# Patient Record
Sex: Male | Born: 1970 | Race: Black or African American | Hispanic: No | Marital: Married | Smoking: Never smoker
Health system: Southern US, Community
[De-identification: ages and names within clinical notes are randomized; demographics above are authoritative.]

## PROBLEM LIST (undated history)

## (undated) DIAGNOSIS — Z9289 Personal history of other medical treatment: Secondary | ICD-10-CM

## (undated) DIAGNOSIS — L309 Dermatitis, unspecified: Secondary | ICD-10-CM

## (undated) DIAGNOSIS — R748 Abnormal levels of other serum enzymes: Secondary | ICD-10-CM

## (undated) DIAGNOSIS — E785 Hyperlipidemia, unspecified: Secondary | ICD-10-CM

## (undated) DIAGNOSIS — N189 Chronic kidney disease, unspecified: Secondary | ICD-10-CM

## (undated) DIAGNOSIS — K649 Unspecified hemorrhoids: Secondary | ICD-10-CM

## (undated) DIAGNOSIS — I10 Essential (primary) hypertension: Secondary | ICD-10-CM

## (undated) HISTORY — DX: Chronic kidney disease, unspecified: N18.9

## (undated) HISTORY — DX: Personal history of other medical treatment: Z92.89

## (undated) HISTORY — DX: Abnormal levels of other serum enzymes: R74.8

## (undated) HISTORY — DX: Hyperlipidemia, unspecified: E78.5

## (undated) HISTORY — DX: Dermatitis, unspecified: L30.9

## (undated) HISTORY — DX: Essential (primary) hypertension: I10

---

## 2006-03-14 ENCOUNTER — Emergency Department (HOSPITAL_COMMUNITY): Admission: EM | Admit: 2006-03-14 | Discharge: 2006-03-14 | Payer: Self-pay | Admitting: Emergency Medicine

## 2007-12-02 ENCOUNTER — Emergency Department (HOSPITAL_COMMUNITY): Admission: EM | Admit: 2007-12-02 | Discharge: 2007-12-02 | Payer: Self-pay | Admitting: *Deleted

## 2008-02-17 ENCOUNTER — Emergency Department (HOSPITAL_COMMUNITY): Admission: EM | Admit: 2008-02-17 | Discharge: 2008-02-17 | Payer: Self-pay | Admitting: Emergency Medicine

## 2008-09-13 ENCOUNTER — Emergency Department (HOSPITAL_COMMUNITY): Admission: EM | Admit: 2008-09-13 | Discharge: 2008-09-14 | Payer: Self-pay | Admitting: Emergency Medicine

## 2009-03-25 ENCOUNTER — Emergency Department (HOSPITAL_COMMUNITY): Admission: EM | Admit: 2009-03-25 | Discharge: 2009-03-25 | Payer: Self-pay | Admitting: Family Medicine

## 2009-03-28 ENCOUNTER — Observation Stay (HOSPITAL_COMMUNITY): Admission: EM | Admit: 2009-03-28 | Discharge: 2009-03-29 | Payer: Self-pay | Admitting: Emergency Medicine

## 2009-03-28 LAB — CONVERTED CEMR LAB
BUN: 16 mg/dL
CO2: 32 meq/L
Calcium: 9.3 mg/dL
Cholesterol: 232 mg/dL
Creatinine, Ser: 1.5 mg/dL
Glucose, Bld: 105 mg/dL
Hgb A1c MFr Bld: 6.2 %

## 2009-03-29 DIAGNOSIS — Z9289 Personal history of other medical treatment: Secondary | ICD-10-CM

## 2009-03-29 HISTORY — DX: Personal history of other medical treatment: Z92.89

## 2009-04-08 ENCOUNTER — Ambulatory Visit: Payer: Self-pay | Admitting: Nurse Practitioner

## 2009-04-08 DIAGNOSIS — E7800 Pure hypercholesterolemia, unspecified: Secondary | ICD-10-CM | POA: Insufficient documentation

## 2009-04-08 DIAGNOSIS — I10 Essential (primary) hypertension: Secondary | ICD-10-CM | POA: Insufficient documentation

## 2009-04-08 LAB — CONVERTED CEMR LAB
Bilirubin Urine: NEGATIVE
Cholesterol, target level: 200 mg/dL
Glucose, Urine, Semiquant: NEGATIVE
HDL goal, serum: 40 mg/dL
Urobilinogen, UA: 0.2

## 2009-06-27 ENCOUNTER — Ambulatory Visit: Payer: Self-pay | Admitting: Nurse Practitioner

## 2009-06-27 DIAGNOSIS — I491 Atrial premature depolarization: Secondary | ICD-10-CM | POA: Insufficient documentation

## 2009-06-28 ENCOUNTER — Ambulatory Visit: Payer: Self-pay | Admitting: Nurse Practitioner

## 2009-06-28 DIAGNOSIS — E876 Hypokalemia: Secondary | ICD-10-CM | POA: Insufficient documentation

## 2009-06-29 ENCOUNTER — Encounter (INDEPENDENT_AMBULATORY_CARE_PROVIDER_SITE_OTHER): Payer: Self-pay | Admitting: Nurse Practitioner

## 2009-06-29 LAB — CONVERTED CEMR LAB
Cholesterol: 149 mg/dL (ref 0–200)
Triglycerides: 67 mg/dL (ref <150)

## 2009-12-23 ENCOUNTER — Ambulatory Visit: Payer: Self-pay | Admitting: Nurse Practitioner

## 2010-03-05 LAB — CONVERTED CEMR LAB
ALT: 36 units/L (ref 0–53)
Alkaline Phosphatase: 94 units/L (ref 39–117)
Creatinine, Ser: 1.25 mg/dL (ref 0.40–1.50)
Eosinophils Relative: 1 % (ref 0–5)
Glucose, Bld: 77 mg/dL (ref 70–99)
HCT: 41.2 % (ref 39.0–52.0)
Hemoglobin: 13.6 g/dL (ref 13.0–17.0)
Lymphocytes Relative: 32 % (ref 12–46)
Lymphs Abs: 1.5 10*3/uL (ref 0.7–4.0)
MCHC: 33 g/dL (ref 30.0–36.0)
Monocytes Absolute: 0.4 10*3/uL (ref 0.1–1.0)
Monocytes Relative: 9 % (ref 3–12)
Neutro Abs: 2.7 10*3/uL (ref 1.7–7.7)
Neutrophils Relative %: 57 % (ref 43–77)
Rapid HIV Screen: NEGATIVE
Sodium: 141 meq/L (ref 135–145)
TSH: 2.077 microintl units/mL (ref 0.350–4.500)
Total Bilirubin: 0.4 mg/dL (ref 0.3–1.2)
Total Protein: 6.9 g/dL (ref 6.0–8.3)

## 2010-03-07 NOTE — Assessment & Plan Note (Signed)
Summary: NEW - Establish Care    Vital Signs:  Patient profile:   40 year old male Height:      64.50 inches Weight:      181.1 pounds BMI:     30.72 BSA:     1.89 Temp:     98.1 degrees F oral Pulse rate:   64 / minute Pulse rhythm:   regular Resp:     16 per minute BP sitting:   136 / 85  (left arm) Cuff size:   regular  Vitals Entered By: Levon Hedger (April 08, 2009 10:31 AM) CC: hospital follow-up [facility] for HTN, Hypertension Management, Lipid Management Is Patient Diabetic? No Pain Assessment Patient in pain? no       Does patient need assistance? Functional Status Self care Ambulation Normal   CC:  hospital follow-up [facility] for HTN, Hypertension Management, and Lipid Management.  History of Present Illness:  Pt into the office to establish care. No previous PCP No previous sugery  Pt was at home and reports that he checked his blood pressure at home and it was elevated. He was previously dx with htn and was started on fluid pills about 2 years ago but he did not maintain f/u so he did not get refills on the medications. This was when he was living in [place]. He has since moved to [place] BP was very elevated when he arrived at the hospital on 03/28/2009. (full d/c reviewed) Pt was admitted with chest pain, left side radiation.  BP elevated with systolic of 161.  BP at discharge was 127/92, with medications. pt has been taking meds as ordered as he filled  his supply of meds with the Rx written in hospital  Chest pain - ruled out for MI.   CXRAY normal. Stress myoview - normal  EF 60-65%     Hypertension History:      He denies headache, chest pain, and palpitations.  He notes no problems with any antihypertensive medication side effects.  Pt is taking medications as ordered.        Positive major cardiovascular risk factors include hyperlipidemia and hypertension.  Negative major cardiovascular risk factors include male age less than 19 years old, no history of  diabetes, and non-tobacco-user status.        Further assessment for target organ damage reveals no history of ASHD, cardiac end-organ damage (CHF/LVH), stroke/TIA, peripheral vascular disease, renal insufficiency, or hypertensive retinopathy.    Lipid Management History:      Positive NCEP/ATP III risk factors include hypertension.  Negative NCEP/ATP III risk factors include male age less than 43 years old, non-diabetic, non-tobacco-user status, no ASHD (atherosclerotic heart disease), no prior stroke/TIA, no peripheral vascular disease, and no history of aortic aneurysm.        The patient states that he does not know about the "Therapeutic Lifestyle Change" diet.  The patient does not know about adjunctive measures for cholesterol lowering.  He expresses no side effects from his lipid-lowering medication.  The patient denies any symptoms to suggest myopathy or liver disease.      Habits & Providers  Alcohol-Tobacco-Diet     Alcohol drinks/day: 0     Tobacco Status: never  Exercise-Depression-Behavior     Drug Use: never  Allergies (verified): No Known Drug Allergies  Family History: father (deceased at age 55 from MI) brother - htn  Social History: 2 children Denies any tobacco, ETOH or Drug useSmoking Status:  never Drug Use:  never  Review of Systems General:  Denies fever. CV:  Denies chest pain or discomfort. Resp:  Denies cough. GI:  Denies abdominal pain, nausea, and vomiting.  Physical Exam  General:  alert.   Head:  normocephalic.   Lungs:  normal breath sounds.   Heart:  normal rate and regular rhythm.   Abdomen:  normal bowel sounds.   Msk:  up to the exam table Neurologic:  alert & oriented X3.   Skin:  color normal.   Psych:  Oriented X3.     Impression & Recommendations:  Problem # 1:  HYPERTENSION, BENIGN ESSENTIAL (ICD-401.1) BP is stable today continue current meds DASH diet handout given His updated medication list for this problem  includes:    Amlodipine Besylate 10 Mg Tabs (Amlodipine besylate) ..... One tablet by mouth daily for blood pressure    Lisinopril 20 Mg Tabs (Lisinopril) .Marland Kitchen... 2 tablets by mouth daily for blood pressure  Orders: UA Dipstick w/o Micro (manual) ([address])  Problem # 2:  HYPERCHOLESTEROLEMIA (ICD-272.0) labs stable will recheck on next visit continue current meds His updated medication list for this problem includes:    Zocor 20 Mg Tabs (Simvastatin) ..... One tablet by mouth nightly for blood pressure  Problem # 3:  CORONARY ARTERY DISEASE, FAMILY HX (ICD-V17.3) reviewed with pt  Complete Medication List: 1)  Amlodipine Besylate 10 Mg Tabs (Amlodipine besylate) .... One tablet by mouth daily for blood pressure 2)  Lisinopril 20 Mg Tabs (Lisinopril) .... 2 tablets by mouth daily for blood pressure 3)  Zocor 20 Mg Tabs (Simvastatin) .... One tablet by mouth nightly for blood pressure 4)  Excedrin Migraine 250-250-65 Mg Tabs (Aspirin-acetaminophen-caffeine) .... Take 1-2 tabs by mouth every 8 hours as needed 5)  Fish Oil 1000 Mg Caps (Omega-3 fatty acids) .... Take one tablet by mouth daily 6)  Multivitamins Tabs (Multiple vitamin) .... Take one tablet by mouth daily  Other Orders: T-Urine Microalbumin w/creat. ratio [phone])  Hypertension Assessment/Plan:      The patient's hypertensive risk group is category B: At least one risk factor (excluding diabetes) with no target organ damage.  His calculated 10 year risk of coronary heart disease is 9 %.  Today's blood pressure is 136/85.  His blood pressure goal is < 140/90.  Lipid Assessment/Plan:      Based on NCEP/ATP III, the patient's risk factor category is "0-1 risk factors".  The patient's lipid goals are as follows: Total cholesterol goal is 200; LDL cholesterol goal is 160; HDL cholesterol goal is 40; Triglyceride goal is 150.    Patient Instructions: 1)  Follow up in 2 months for blood pressure 2)  You will need EKG. 3)   Fasting labs - do not eat after midnight before this visit. 4)  Take your medications as ordered 5)  You will need to get an orange card. Prescriptions: ZOCOR 20 MG TABS (SIMVASTATIN) One tablet by mouth nightly for blood pressure  #30 x 1   Entered and Authorized by:   Lehman Prom FNP   Signed by:   Lehman Prom FNP on 04/08/2009   Method used:   Print then Give to Patient   RxID:   [id number] LISINOPRIL 20 MG TABS (LISINOPRIL) 2 tablets by mouth daily for blood pressure  #60 x 1   Entered and Authorized by:   Lehman Prom FNP   Signed by:   Lehman Prom FNP on 04/08/2009   Method used:   Print then Give to Patient   RxID:  [id number] AMLODIPINE BESYLATE 10 MG TABS (AMLODIPINE BESYLATE) One tablet by mouth daily for blood pressure  #30 x 1   Entered and Authorized by:   Lehman Prom FNP   Signed by:   Lehman Prom FNP on 04/08/2009   Method used:   Print then Give to Patient   RxID:   [id number]   Laboratory Results   Urine Tests  Date/Time Received: April 08, 2009 10:49 AM  Date/Time Reported: April 08, 2009 10:49 AM   Routine Urinalysis   Color: lt. yellow Appearance: Clear Glucose: negative   (Normal Range: Negative) Bilirubin: negative   (Normal Range: Negative) Ketone: negative   (Normal Range: Negative) Spec. Gravity: 1.010   (Normal Range: 1.003-1.035) Blood: trace-intact   (Normal Range: Negative) pH: 7.5   (Normal Range: 5.0-8.0) Protein: trace   (Normal Range: Negative) Urobilinogen: 0.2   (Normal Range: 0-1) Nitrite: negative   (Normal Range: Negative) Leukocyte Esterace: negative   (Normal Range: Negative)         CXR  Procedure date:  03/28/2009  Findings:      Normal chest  Echocardiogram  Procedure date:  03/28/2009  Findings:      left ventricultar cavity size, moderate concentric hypertrophy. systolic function was normal.  estimated EF 60-65%, wall motionwas normal. Grade 1 diastolic  dysfunction   CXR  Procedure date:  03/28/2009  Findings:      Normal chest  Echocardiogram  Procedure date:  03/28/2009  Findings:      left ventricultar cavity size, moderate concentric hypertrophy. systolic function was normal.  estimated EF 60-65%, wall motionwas normal. Grade 1 diastolic dysfunction

## 2010-03-07 NOTE — Letter (Signed)
Summary: *HSN Results Follow up  [facility]  [address] [place], [place] [address]   Phone: [phone]  Fax: [phone]      06/28/2009   Joshua Hickman [address] [place], [place]  [address]   Dear  Mr. [name],                            ____S.Drinkard,FNP   ____D. Gore,FNP       ____B. McPherson,MD   ____V. Rankins,MD    ____E. Mulberry,MD    _X___N. Daphine Deutscher, FNP  ____D. Reche Dixon, MD    ____K. Philipp Deputy, MD    ____Other     This letter is to inform you that your recent test(s):  _______Pap Smear    ____X___Lab Test     _______X-ray   Comments:  Labs done during recent office visit ok except your potassium was a little low.  Try to eat potassium rich foods daily such as bananas, oranges, orange juice, sweet potatoes to keep levels normal.     _________________________________________________________ If you have any questions, please contact our office [phone].                    Sincerely,    Lehman Prom FNP [facility]

## 2010-03-07 NOTE — Assessment & Plan Note (Signed)
Summary: HTN/Hypercholesterolemia   Vital Signs:  Patient profile:   40 year old male Weight:      182.0 pounds BMI:     30.87 BSA:     1.89 Temp:     98.1 degrees F oral Pulse rate:   56 / minute Pulse rhythm:   regular Resp:     16 per minute BP sitting:   118 / 76  (left arm) Cuff size:   regular  Vitals Entered ByLevon Hedger (Jun 27, 2009 2:28 PM) CC: follow-up visit HTN, Hypertension Management, Lipid Management Is Patient Diabetic? No Pain Assessment Patient in pain? no       Does patient need assistance? Functional Status Self care Ambulation Normal   CC:  follow-up visit HTN, Hypertension Management, and Lipid Management.  History of Present Illness:  Pt into the office to f/u on htn Established care in this office 2 months ago.  No acute problems since last visit here Pt presents today with all his medications     Hypertension History:      He denies headache, chest pain, and palpitations.  He notes no problems with any antihypertensive medication side effects.        Positive major cardiovascular risk factors include hyperlipidemia and hypertension.  Negative major cardiovascular risk factors include male age less than 57 years old, no history of diabetes, and non-tobacco-user status.        Further assessment for target organ damage reveals no history of ASHD, cardiac end-organ damage (CHF/LVH), stroke/TIA, peripheral vascular disease, renal insufficiency, or hypertensive retinopathy.    Lipid Management History:      Positive NCEP/ATP III risk factors include hypertension.  Negative NCEP/ATP III risk factors include male age less than 62 years old, non-diabetic, non-tobacco-user status, no ASHD (atherosclerotic heart disease), no prior stroke/TIA, no peripheral vascular disease, and no history of aortic aneurysm.        The patient states that he does not know about the "Therapeutic Lifestyle Change" diet.  The patient does not know about adjunctive  measures for cholesterol lowering.  Adjunctive measures started by the patient include omega-3 supplements.  He expresses no side effects from his lipid-lowering medication.  The patient denies any symptoms to suggest myopathy or liver disease.     Habits & Providers  Alcohol-Tobacco-Diet     Alcohol drinks/day: 0     Tobacco Status: never  Exercise-Depression-Behavior     Have you felt down or hopeless? no     Have you felt little pleasure in things? no     Depression Counseling: not indicated; screening negative for depression     Drug Use: never  Medications Prior to Update: 1)  Amlodipine Besylate 10 Mg Tabs (Amlodipine Besylate) .... One Tablet By Mouth Daily For Blood Pressure 2)  Lisinopril 20 Mg Tabs (Lisinopril) .... 2 Tablets By Mouth Daily For Blood Pressure 3)  Zocor 20 Mg Tabs (Simvastatin) .... One Tablet By Mouth Nightly For Blood Pressure 4)  Excedrin Migraine 250-250-65 Mg Tabs (Aspirin-Acetaminophen-Caffeine) .... Take 1-2 Tabs By Mouth Every 8 Hours As Needed 5)  Fish Oil 1000 Mg Caps (Omega-3 Fatty Acids) .... Take One Tablet By Mouth Daily 6)  Multivitamins  Tabs (Multiple Vitamin) .... Take One Tablet By Mouth Daily  Allergies (verified): No Known Drug Allergies  Review of Systems CV:  Denies chest pain or discomfort. Resp:  Denies cough. GI:  Denies abdominal pain, nausea, and vomiting.  Physical Exam  General:  alert.  Head:  normocephalic.   Lungs:  normal breath sounds.   Heart:  normal rate and regular rhythm.   Abdomen:  normal bowel sounds.   Msk:  normal ROM.   Neurologic:  alert & oriented X3 and gait normal.     Impression & Recommendations:  Problem # 1:  HYPERTENSION, BENIGN ESSENTIAL (ICD-401.1) stable DASH  pt is taking meds as ordered His updated medication list for this problem includes:    Amlodipine Besylate 10 Mg Tabs (Amlodipine besylate) ..... One tablet by mouth daily for blood pressure    Lisinopril 20 Mg Tabs  (Lisinopril) .Marland Kitchen... 2 tablets by mouth daily for blood pressure  Orders: EKG w/ Interpretation ([address]) T-Comprehensive Metabolic Panel ([address]-47829) T-CBC w/Diff ([address]657) Rapid HIV  ([address]) T-TSH (295[address]-41324)  Problem # 2:  HYPERCHOLESTEROLEMIA (ICD-272.0) will check lipids on tomorrow as pt is not fasting today His updated medication list for this problem includes:    Zocor 20 Mg Tabs (Simvastatin) ..... One tablet by mouth nightly for blood pressure  Problem # 3:  ATRIAL PREMATURE BEATS (ICD-427.61) noted on EKG today reviewed with pt  Complete Medication List: 1)  Amlodipine Besylate 10 Mg Tabs (Amlodipine besylate) .... One tablet by mouth daily for blood pressure 2)  Lisinopril 20 Mg Tabs (Lisinopril) .... 2 tablets by mouth daily for blood pressure 3)  Zocor 20 Mg Tabs (Simvastatin) .... One tablet by mouth nightly for blood pressure 4)  Excedrin Migraine 250-250-65 Mg Tabs (Aspirin-acetaminophen-caffeine) .... Take 1-2 tabs by mouth every 8 hours as needed 5)  Fish Oil 1000 Mg Caps (Omega-3 fatty acids) .... Take one tablet by mouth daily 6)  Multivitamins Tabs (Multiple vitamin) .... Take one tablet by mouth daily  Hypertension Assessment/Plan:      The patient's hypertensive risk group is category B: At least one risk factor (excluding diabetes) with no target organ damage.  His calculated 10 year risk of coronary heart disease is 7 %.  Today's blood pressure is 118/76.  His blood pressure goal is < 140/90.  Lipid Assessment/Plan:      Based on NCEP/ATP III, the patient's risk factor category is "0-1 risk factors".  The patient's lipid goals are as follows: Total cholesterol goal is 200; LDL cholesterol goal is 160; HDL cholesterol goal is 40; Triglyceride goal is 150.     Patient Instructions: 1)  Labs done today - EXCEPT cholesterol 2)  You need to be fasting of everything except WATER when this is done. 3)  FRONT DESK - schedule an appointment in the early AM for  lipids (no charge - attach to today's visit) 4)  Follow up in 6 months - beginning of November for high blood pressure of sooner if necessary. Prescriptions: LISINOPRIL 20 MG TABS (LISINOPRIL) 2 tablets by mouth daily for blood pressure  #60 x 5   Entered and Authorized by:   Lehman Prom FNP   Signed by:   Lehman Prom FNP on 06/27/2009   Method used:   Print then Give to Patient   RxID:   [id number] AMLODIPINE BESYLATE 10 MG TABS (AMLODIPINE BESYLATE) One tablet by mouth daily for blood pressure  #30 x 5   Entered and Authorized by:   Lehman Prom FNP   Signed by:   Lehman Prom FNP on 06/27/2009   Method used:   Print then Give to Patient   RxID:   [id number]    EKG  Procedure date:  06/27/2009  Findings:      sinus rhythm  with premature atrial complexes  Laboratory Results  Date/Time Received: Jun 27, 2009   Other Tests  Rapid HIV: negative

## 2010-03-07 NOTE — Assessment & Plan Note (Signed)
Summary: HTN   Vital Signs:  Patient profile:   40 year old male Weight:      177.9 pounds BMI:     30.17 Pulse rate:   60 / minute Pulse rhythm:   regular Resp:     16 per minute BP sitting:   120 / 90  (left arm) Cuff size:   regular  Vitals Entered By: Levon Hedger (December 23, 2009 9:19 AM)  Nutrition Counseling: Patient's BMI is greater than 25 and therefore counseled on weight management options. CC: follow-up visit BP, Hypertension Management, Lipid Management Is Patient Diabetic? No Pain Assessment Patient in pain? no       Does patient need assistance? Functional Status Self care Ambulation Normal   CC:  follow-up visit BP, Hypertension Management, and Lipid Management.  History of Present Illness:  Pt into the office for f/u on htn. Pt is doing well.  No acute complaints. Pt is taking medications as ordered  Hypertension History:      He denies headache, chest pain, and palpitations.  He notes no problems with any antihypertensive medication side effects.        Positive major cardiovascular risk factors include hyperlipidemia and hypertension.  Negative major cardiovascular risk factors include male age less than 62 years old, no history of diabetes, and non-tobacco-user status.        Further assessment for target organ damage reveals no history of ASHD, cardiac end-organ damage (CHF/LVH), stroke/TIA, peripheral vascular disease, renal insufficiency, or hypertensive retinopathy.    Lipid Management History:      Positive NCEP/ATP III risk factors include hypertension.  Negative NCEP/ATP III risk factors include male age less than 19 years old, non-diabetic, non-tobacco-user status, no ASHD (atherosclerotic heart disease), no prior stroke/TIA, no peripheral vascular disease, and no history of aortic aneurysm.        The patient states that he does not know about the "Therapeutic Lifestyle Change" diet.  Adjunctive measures started by the patient include  weight reduction.  He expresses no side effects from his lipid-lowering medication.  Comments include: Pt is taking the medications as ordered.  The patient denies any symptoms to suggest myopathy or liver disease.      Allergies (verified): No Known Drug Allergies  Review of Systems General:  Denies fever. CV:  Denies chest pain or discomfort. Resp:  Denies cough. GI:  Denies abdominal pain, nausea, and vomiting.  Physical Exam  General:  alert.   Head:  normocephalic.   Lungs:  normal breath sounds.   Heart:  normal rate and regular rhythm.   Abdomen:  normal bowel sounds.   Msk:  normal ROM.   Neurologic:  alert & oriented X3.   Skin:  color normal.   Psych:  Oriented X3.     Impression & Recommendations:  Problem # 1:  HYPERTENSION, BENIGN ESSENTIAL (ICD-401.1) BP is doing well. Continue current medication   DASH diet reviewed declined flu vaccine today His updated medication list for this problem includes:    Amlodipine Besylate 10 Mg Tabs (Amlodipine besylate) ..... One tablet by mouth daily for blood pressure    Lisinopril 20 Mg Tabs (Lisinopril) .Marland Kitchen... 2 tablets by mouth daily for blood pressure  Problem # 2:  HYPERCHOLESTEROLEMIA (ICD-272.0) Will check on next visit continue current medications His updated medication list for this problem includes:    Zocor 20 Mg Tabs (Simvastatin) ..... One tablet by mouth nightly for cholesterol  Complete Medication List: 1)  Amlodipine Besylate 10 Mg  Tabs (Amlodipine besylate) .... One tablet by mouth daily for blood pressure 2)  Lisinopril 20 Mg Tabs (Lisinopril) .... 2 tablets by mouth daily for blood pressure 3)  Zocor 20 Mg Tabs (Simvastatin) .... One tablet by mouth nightly for cholesterol 4)  Excedrin Migraine 250-250-65 Mg Tabs (Aspirin-acetaminophen-caffeine) .... Take 1-2 tabs by mouth every 8 hours as needed 5)  Fish Oil 1000 Mg Caps (Omega-3 fatty acids) .... Take one tablet by mouth daily 6)  Multivitamins Tabs  (Multiple vitamin) .... Take one tablet by mouth daily  Hypertension Assessment/Plan:      The patient's hypertensive risk group is category B: At least one risk factor (excluding diabetes) with no target organ damage.  His calculated 10 year risk of coronary heart disease is 4 %.  Today's blood pressure is 120/90.  His blood pressure goal is < 140/90.  Lipid Assessment/Plan:      Based on NCEP/ATP III, the patient's risk factor category is "0-1 risk factors".  The patient's lipid goals are as follows: Total cholesterol goal is 200; LDL cholesterol goal is 160; HDL cholesterol goal is 40; Triglyceride goal is 150.    Patient Instructions: 1)  You have declined the flu vaccine.   2)  If you change you mind then call to schedule an appointment with the nurse 3)  Follow up in 6 months for blood pressure 4)  Take medications with water before this appointment but no food after midnight for labs.  Will need lipids, cbc, cmp, rapid hiv, tsh Prescriptions: ZOCOR 20 MG TABS (SIMVASTATIN) One tablet by mouth nightly for cholesterol  #30 x 6   Entered and Authorized by:   Lehman Prom FNP   Signed by:   Lehman Prom FNP on 12/23/2009   Method used:   Print then Give to Patient   RxID:   [id number] LISINOPRIL 20 MG TABS (LISINOPRIL) 2 tablets by mouth daily for blood pressure  #60 x 6   Entered and Authorized by:   Lehman Prom FNP   Signed by:   Lehman Prom FNP on 12/23/2009   Method used:   Print then Give to Patient   RxID:   [id number] AMLODIPINE BESYLATE 10 MG TABS (AMLODIPINE BESYLATE) One tablet by mouth daily for blood pressure  #30 x 6   Entered and Authorized by:   Lehman Prom FNP   Signed by:   Lehman Prom FNP on 12/23/2009   Method used:   Print then Give to Patient   RxID:   [id number]    Orders Added: 1)  Est. Patient Level III [56433]    Prevention & Chronic Care Immunizations   Influenza vaccine: declined  (12/23/2009)   Influenza  vaccine deferral: Refused  (12/23/2009)    Tetanus booster: Not documented    Pneumococcal vaccine: Not documented  Other Screening   Smoking status: never  (06/27/2009)  Lipids   Total Cholesterol: 149  (06/28/2009)   LDL: 88  (06/28/2009)   LDL Direct: Not documented   HDL: 48  (06/28/2009)   Triglycerides: 67  (06/28/2009)    SGOT (AST): 23  (06/27/2009)   SGPT (ALT): 36  (06/27/2009)   Alkaline phosphatase: 94  (06/27/2009)   Total bilirubin: 0.4  (06/27/2009)  Hypertension   Last Blood Pressure: 120 / 90  (12/23/2009)   Serum creatinine: 1.25  (06/27/2009)   Serum potassium 3.4  (06/27/2009)  Self-Management Support :    Hypertension self-management support: Not documented    Lipid self-management support: Not documented

## 2010-03-07 NOTE — Letter (Signed)
Summary: Lipid Letter  [facility]  [address] [place], [place] [address]   Phone: [phone]  Fax: [phone]    06/29/2009  Joshua Hickman [address] [place], [place]  [address]  Dear Joshua Hickman:  We have carefully reviewed your last lipid profile from 06/28/2009 and the results are noted below with a summary of recommendations for lipid management.    Cholesterol:       149     Goal: less than 200   HDL "good" Cholesterol:   48     Goal: greater than 40   LDL "bad" Cholesterol:   88     Goal: less than 100   Triglycerides:       67     Goal: less than 150    Your cholesterol labs are normal.     Current Medications: 1)    Amlodipine Besylate 10 Mg Tabs (Amlodipine besylate) .... One tablet by mouth daily for blood pressure 2)    Lisinopril 20 Mg Tabs (Lisinopril) .... 2 tablets by mouth daily for blood pressure 3)    Zocor 20 Mg Tabs (Simvastatin) .... One tablet by mouth nightly for blood pressure 4)    Excedrin Migraine 250-250-65 Mg Tabs (Aspirin-acetaminophen-caffeine) .... Take 1-2 tabs by mouth every 8 hours as needed 5)    Fish Oil 1000 Mg Caps (Omega-3 fatty acids) .... Take one tablet by mouth daily 6)    Multivitamins  Tabs (Multiple vitamin) .... Take one tablet by mouth daily  If you have any questions, please call. We appreciate being able to work with you.   Sincerely,    [facility] Lehman Prom FNP

## 2010-03-07 NOTE — Letter (Signed)
Summary: Handout Printed  Printed Handout:  - Diet - Low-Cholesterol Guidelines 

## 2010-04-26 LAB — POCT I-STAT, CHEM 8
BUN: 18 mg/dL (ref 6–23)
Calcium, Ion: 1.17 mmol/L (ref 1.12–1.32)
HCT: 50 % (ref 39.0–52.0)
Hemoglobin: 17 g/dL (ref 13.0–17.0)
TCO2: 31 mmol/L (ref 0–100)

## 2010-04-26 LAB — CBC
Hemoglobin: 15.7 g/dL (ref 13.0–17.0)
MCHC: 34.5 g/dL (ref 30.0–36.0)
RBC: 5.1 MIL/uL (ref 4.22–5.81)
RDW: 13 % (ref 11.5–15.5)
WBC: 5.4 10*3/uL (ref 4.0–10.5)

## 2010-04-26 LAB — CK TOTAL AND CKMB (NOT AT ARMC)
CK, MB: 1.9 ng/mL (ref 0.3–4.0)
Total CK: 529 U/L — ABNORMAL HIGH (ref 7–232)

## 2010-04-26 LAB — CARDIAC PANEL(CRET KIN+CKTOT+MB+TROPI)
CK, MB: 1.7 ng/mL (ref 0.3–4.0)
CK, MB: 1.8 ng/mL (ref 0.3–4.0)
Relative Index: 0.4 (ref 0.0–2.5)
Total CK: 473 U/L — ABNORMAL HIGH (ref 7–232)
Total CK: 509 U/L — ABNORMAL HIGH (ref 7–232)
Troponin I: 0.01 ng/mL (ref 0.00–0.06)

## 2010-04-26 LAB — BASIC METABOLIC PANEL
BUN: 15 mg/dL (ref 6–23)
BUN: 16 mg/dL (ref 6–23)
Calcium: 9.1 mg/dL (ref 8.4–10.5)
Chloride: 99 mEq/L (ref 96–112)
Creatinine, Ser: 1.5 mg/dL (ref 0.4–1.5)
GFR calc Af Amer: >60 mL/min (ref >60)
GFR calc non Af Amer: 52 mL/min — ABNORMAL LOW (ref >60)
Sodium: 138 mEq/L (ref 135–145)

## 2010-04-26 LAB — COMPREHENSIVE METABOLIC PANEL
ALT: 71 U/L — ABNORMAL HIGH (ref 0–53)
AST: 44 U/L — ABNORMAL HIGH (ref 0–37)
Alkaline Phosphatase: 92 U/L (ref 39–117)
GFR calc Af Amer: >60 mL/min (ref >60)
Potassium: 3.2 mEq/L — ABNORMAL LOW (ref 3.5–5.1)
Sodium: 140 mEq/L (ref 135–145)
Total Bilirubin: 0.8 mg/dL (ref 0.3–1.2)
Total Protein: 7.3 g/dL (ref 6.0–8.3)

## 2010-04-26 LAB — POCT CARDIAC MARKERS
CKMB, poc: <1 ng/mL — ABNORMAL LOW (ref 1.0–8.0)
Myoglobin, poc: 113 ng/mL (ref 12–200)
Troponin i, poc: <0.05 ng/mL (ref 0.00–0.09)

## 2010-04-26 LAB — MAGNESIUM: Magnesium: 2.4 mg/dL (ref 1.5–2.5)

## 2010-04-26 LAB — DIFFERENTIAL
Basophils Absolute: 0 10*3/uL (ref 0.0–0.1)
Eosinophils Absolute: 0.1 10*3/uL (ref 0.0–0.7)
Lymphs Abs: 2.1 10*3/uL (ref 0.7–4.0)
Monocytes Relative: 8 % (ref 3–12)

## 2010-04-26 LAB — LIPID PANEL
LDL Cholesterol: 177 mg/dL — ABNORMAL HIGH (ref 0–99)
Total CHOL/HDL Ratio: 5.4 RATIO
VLDL: 12 mg/dL (ref 0–40)

## 2010-04-26 LAB — HEMOGLOBIN A1C: Mean Plasma Glucose: 131 mg/dL

## 2010-05-14 ENCOUNTER — Emergency Department (HOSPITAL_COMMUNITY): Payer: Self-pay

## 2010-05-14 ENCOUNTER — Emergency Department (HOSPITAL_COMMUNITY)
Admission: EM | Admit: 2010-05-14 | Discharge: 2010-05-14 | Disposition: A | Discharge status: Home / Self Care | Payer: Self-pay | Attending: Emergency Medicine | Admitting: Emergency Medicine

## 2010-05-14 DIAGNOSIS — I10 Essential (primary) hypertension: Secondary | ICD-10-CM | POA: Insufficient documentation

## 2010-05-14 DIAGNOSIS — I498 Other specified cardiac arrhythmias: Secondary | ICD-10-CM | POA: Insufficient documentation

## 2010-05-14 DIAGNOSIS — R0789 Other chest pain: Secondary | ICD-10-CM | POA: Insufficient documentation

## 2010-05-14 LAB — GLUCOSE, CAPILLARY: Glucose-Capillary: 77 mg/dL (ref 70–99)

## 2010-05-14 LAB — POCT CARDIAC MARKERS
Troponin i, poc: <0.05 ng/mL (ref 0.00–0.09)
Troponin i, poc: <0.05 ng/mL (ref 0.00–0.09)

## 2010-05-14 LAB — BASIC METABOLIC PANEL
BUN: 13 mg/dL (ref 6–23)
CO2: 29 mEq/L (ref 19–32)
Chloride: 104 mEq/L (ref 96–112)
Creatinine, Ser: 1.39 mg/dL (ref 0.4–1.5)
Glucose, Bld: 102 mg/dL — ABNORMAL HIGH (ref 70–99)
Potassium: 3.4 mEq/L — ABNORMAL LOW (ref 3.5–5.1)

## 2010-05-14 LAB — CBC
MCH: 30.3 pg (ref 26.0–34.0)
MCV: 88 fL (ref 78.0–100.0)
Platelets: 223 10*3/uL (ref 150–400)
RBC: 4.91 MIL/uL (ref 4.22–5.81)
RDW: 12.9 % (ref 11.5–15.5)

## 2010-05-14 LAB — DIFFERENTIAL
Basophils Relative: 1 % (ref 0–1)
Eosinophils Absolute: 0.1 10*3/uL (ref 0.0–0.7)
Eosinophils Relative: 2 % (ref 0–5)
Lymphs Abs: 1.6 10*3/uL (ref 0.7–4.0)
Monocytes Relative: 9 % (ref 3–12)
Neutrophils Relative %: 51 % (ref 43–77)

## 2010-05-22 LAB — POCT CARDIAC MARKERS
CKMB, poc: <1 ng/mL — ABNORMAL LOW (ref 1.0–8.0)
Myoglobin, poc: 69.1 ng/mL (ref 12–200)
Troponin i, poc: <0.05 ng/mL (ref 0.00–0.09)

## 2010-11-07 LAB — DIFFERENTIAL
Basophils Absolute: 0
Eosinophils Relative: 2
Lymphocytes Relative: 36
Lymphs Abs: 1.7
Neutrophils Relative %: 54

## 2010-11-07 LAB — POCT I-STAT, CHEM 8
BUN: 14
Hemoglobin: 15.6
Potassium: 3.5
Sodium: 142
TCO2: 29

## 2010-11-07 LAB — POCT CARDIAC MARKERS
CKMB, poc: 1.3
CKMB, poc: 1.3
Myoglobin, poc: 98.3
Troponin i, poc: <0.05

## 2010-11-07 LAB — CBC
HCT: 43.3
Platelets: 301
RDW: 12.4
WBC: 4.7

## 2012-05-25 HISTORY — PX: TRANSTHORACIC ECHOCARDIOGRAM: SHX275

## 2012-05-28 ENCOUNTER — Other Ambulatory Visit (HOSPITAL_COMMUNITY): Payer: Self-pay | Admitting: Cardiovascular Disease

## 2012-05-28 DIAGNOSIS — R079 Chest pain, unspecified: Secondary | ICD-10-CM

## 2012-05-28 DIAGNOSIS — I10 Essential (primary) hypertension: Secondary | ICD-10-CM

## 2012-06-04 ENCOUNTER — Ambulatory Visit (HOSPITAL_COMMUNITY)
Admission: RE | Admit: 2012-06-04 | Discharge: 2012-06-04 | Disposition: A | Discharge status: Home / Self Care | Payer: BC Managed Care – PPO | Source: Ambulatory Visit | Attending: Cardiovascular Disease | Admitting: Cardiovascular Disease

## 2012-06-04 DIAGNOSIS — R079 Chest pain, unspecified: Secondary | ICD-10-CM | POA: Insufficient documentation

## 2012-06-04 DIAGNOSIS — I10 Essential (primary) hypertension: Secondary | ICD-10-CM | POA: Insufficient documentation

## 2012-06-04 NOTE — Progress Notes (Signed)
2D Echo Performed 06/04/2012    Dilan Novosad, RCS  

## 2012-06-23 ENCOUNTER — Other Ambulatory Visit: Payer: Self-pay | Admitting: Cardiovascular Disease

## 2012-06-23 LAB — LIPID PANEL
HDL: 36 mg/dL — ABNORMAL LOW (ref >39)
LDL Cholesterol: 70 mg/dL (ref 0–99)
Total CHOL/HDL Ratio: 3.4 Ratio

## 2012-08-11 ENCOUNTER — Telehealth: Payer: Self-pay | Admitting: Cardiovascular Disease

## 2012-08-11 NOTE — Telephone Encounter (Signed)
Leave message for patient to call office

## 2012-08-11 NOTE — Telephone Encounter (Signed)
Please check and see if you received a Rx refill for Mr. Joshua Hickman. He stated that [facility] has faxed it over several times since the 27th of June.

## 2012-08-13 ENCOUNTER — Other Ambulatory Visit: Payer: Self-pay

## 2012-08-13 MED ORDER — ATORVASTATIN CALCIUM 40 MG PO TABS: 40.0000 mg | ORAL_TABLET | Freq: Every day | ORAL | Status: DC

## 2012-08-13 NOTE — Telephone Encounter (Signed)
Rx was sent to pharmacy electronically. 

## 2012-12-06 ENCOUNTER — Encounter: Payer: Self-pay | Admitting: *Deleted

## 2012-12-08 ENCOUNTER — Encounter: Payer: Self-pay | Admitting: Internal Medicine

## 2012-12-09 ENCOUNTER — Ambulatory Visit: Payer: BC Managed Care – PPO | Admitting: Internal Medicine

## 2012-12-18 ENCOUNTER — Ambulatory Visit (INDEPENDENT_AMBULATORY_CARE_PROVIDER_SITE_OTHER): Payer: BC Managed Care – PPO | Admitting: Internal Medicine

## 2012-12-18 ENCOUNTER — Encounter: Payer: Self-pay | Admitting: Internal Medicine

## 2012-12-18 VITALS — BP 120/70 | HR 63 | Ht 65.5 in | Wt 185.5 lb

## 2012-12-18 DIAGNOSIS — E7800 Pure hypercholesterolemia, unspecified: Secondary | ICD-10-CM

## 2012-12-18 DIAGNOSIS — Z79899 Other long term (current) drug therapy: Secondary | ICD-10-CM

## 2012-12-18 DIAGNOSIS — E785 Hyperlipidemia, unspecified: Secondary | ICD-10-CM

## 2012-12-18 DIAGNOSIS — I10 Essential (primary) hypertension: Secondary | ICD-10-CM

## 2012-12-18 LAB — COMPREHENSIVE METABOLIC PANEL
AST: 26 U/L (ref 0–37)
Alkaline Phosphatase: 99 U/L (ref 39–117)
BUN: 14 mg/dL (ref 6–23)
Creat: 1.44 mg/dL — ABNORMAL HIGH (ref 0.50–1.35)
Total Bilirubin: 0.8 mg/dL (ref 0.3–1.2)

## 2012-12-18 MED ORDER — ESOMEPRAZOLE MAGNESIUM 40 MG PO CPDR: 40.0000 mg | DELAYED_RELEASE_CAPSULE | Freq: Every day | ORAL | Status: DC

## 2012-12-18 NOTE — Progress Notes (Signed)
OFFICE NOTE  Chief Complaint:  Establish new cardiology care  Primary Care Physician: No primary provider on file.  HPI:  Joshua Hickman is a pleasant 42 year old male who was previous he followed by Dr. Alanda Amass and this establishing care with me today.  His great-grandfather he is a patient of Dr. Alanda Amass as well. He recently had problems with indigestion symptoms and was placed on protonix, but feels this medicine actually is causing him some problems.  He is requesting a switch to a different medicine today. He has been taking atorvastatin and his total cholesterol came down nicely from 225-140, his LDL cholesterol is 70.  He is also on medication for hypertension and that is now much better controlled.  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  PMHx:  Past Medical History  Diagnosis Date  . Hypertension   . Hyperlipidemia   . History of nuclear stress test 03/29/2009    normal exam without evidence of exercise-induced sichemia    Past Surgical History  Procedure Laterality Date  . Transthoracic echocardiogram  05/25/2012    EF 55-60%, mild conc hypertrophy, normal systolic function; mild MR    FAMHx:  Family History  Problem Relation Age of Onset  . Heart attack Father   . Hypertension Mother   . Hypertension Brother     also abdominal cancer    SOCHx:   reports that he has never smoked. He has never used smokeless tobacco. He reports that he does not drink alcohol or use illicit drugs.  ALLERGIES:  No Known Allergies  ROS: A comprehensive review of systems was negative except for: Gastrointestinal: positive for reflux symptoms  HOME MEDS: Current Outpatient Prescriptions  Medication Sig Dispense Refill  . amLODipine (NORVASC) 10 MG tablet Take 10 mg by mouth daily.      Marland Kitchen aspirin 81 MG tablet Take 81 mg by mouth daily.      Marland Kitchen atorvastatin (LIPITOR) 40 MG tablet Take 1 tablet (40 mg total) by mouth  daily.  30 tablet  9  . lisinopril (PRINIVIL,ZESTRIL) 40 MG tablet Take 40 mg by mouth daily.      . pantoprazole (PROTONIX) 40 MG tablet Take 40 mg by mouth daily.       No current facility-administered medications for this visit.    LABS/IMAGING: No results found for this or any previous visit (from the past 48 hour(s)). No results found.  VITALS: BP 120/70  Pulse 63  Ht 5' 5.5" (1.664 m)  Wt 185 lb 8 oz (84.142 kg)  BMI 30.39 kg/m2  EXAM: General appearance: alert and no distress Neck: no carotid bruit and no JVD Lungs: clear to auscultation bilaterally Heart: regular rate and rhythm, S1, S2 normal, no murmur, click, rub or gallop Abdomen: soft, non-tender; bowel sounds normal; no masses,  no organomegaly Extremities: extremities normal, atraumatic, no cyanosis or edema Pulses: 2+ and symmetric Skin: Skin color, texture, turgor normal. No rashes or lesions Neurologic: Grossly normal Psych: Normal  EKG: Sinus rhythm with PACs at 63  ASSESSMENT: 1. Hypertension-controlled 2. Dyslipidemia on statin 3. PACs 4. GERD-intolerant to protonix  PLAN: 1.   Mr. Calixte is doing well and has had a marked improvement with medications. His blood pressures now well controlled on lisinopril and amlodipine. He is cholesterol is much improved on atorvastatin. His reflexes still an issue and he is intolerant to protonix since he says the medicine makes him "feel bad". He  is interested in trying a different medicine today and I recommended Nexium. I provide a prescription for that. He will have another cholesterol test today metabolic profile. Also given to primary care providers names as he does not have a primary and will need to establish one. Plan to see him back in 6 months.  Chrystie Nose, MD, [facility] Attending Cardiologist [facility]  Joshua Hickman,Joshua Hickman 12/18/2012, 9:12 AM

## 2012-12-18 NOTE — Patient Instructions (Signed)
Please have fasting blood work.  NMR with lipid, CMP  Your physician wants you to follow-up in: 6 months with Dr. Rennis Golden. You will receive a reminder letter in the mail two months in advance. If you don't receive a letter, please call our office to schedule the follow-up appointment.

## 2012-12-19 LAB — NMR LIPOPROFILE WITH LIPIDS
HDL Size: 8.5 nm — ABNORMAL LOW (ref >=9.2)
LDL (calc): 83 mg/dL (ref <100)
LDL Particle Number: 1385 nmol/L — ABNORMAL HIGH (ref <1000)
Large VLDL-P: 2.8 nmol/L — ABNORMAL HIGH (ref <=2.7)
Small LDL Particle Number: 963 nmol/L — ABNORMAL HIGH (ref <=527)
VLDL Size: 48.8 nm — ABNORMAL HIGH (ref <=46.6)

## 2012-12-29 ENCOUNTER — Encounter: Payer: Self-pay | Admitting: *Deleted

## 2015-01-18 ENCOUNTER — Emergency Department (HOSPITAL_COMMUNITY)
Admission: EM | Admit: 2015-01-18 | Discharge: 2015-01-18 | Disposition: A | Discharge status: Home / Self Care | Payer: Self-pay | Attending: Emergency Medicine | Admitting: Emergency Medicine

## 2015-01-18 ENCOUNTER — Encounter (HOSPITAL_COMMUNITY): Payer: Self-pay

## 2015-01-18 DIAGNOSIS — K5900 Constipation, unspecified: Secondary | ICD-10-CM | POA: Insufficient documentation

## 2015-01-18 DIAGNOSIS — E785 Hyperlipidemia, unspecified: Secondary | ICD-10-CM | POA: Insufficient documentation

## 2015-01-18 DIAGNOSIS — I10 Essential (primary) hypertension: Secondary | ICD-10-CM | POA: Insufficient documentation

## 2015-01-18 DIAGNOSIS — K645 Perianal venous thrombosis: Secondary | ICD-10-CM | POA: Insufficient documentation

## 2015-01-18 DIAGNOSIS — Z7982 Long term (current) use of aspirin: Secondary | ICD-10-CM | POA: Insufficient documentation

## 2015-01-18 DIAGNOSIS — Z79899 Other long term (current) drug therapy: Secondary | ICD-10-CM | POA: Insufficient documentation

## 2015-01-18 HISTORY — DX: Unspecified hemorrhoids: K64.9

## 2015-01-18 MED ORDER — BELLADONNA-OPIUM 16.2-30 MG RE SUPP: 30.0000 mg | Freq: Three times a day (TID) | RECTAL | Status: DC | PRN

## 2015-01-18 MED ORDER — HYDROCODONE-ACETAMINOPHEN 5-325 MG PO TABS: 1.0000 | ORAL_TABLET | ORAL | Status: DC | PRN

## 2015-01-18 MED ORDER — DOCUSATE SODIUM 50 MG PO CAPS: 50.0000 mg | ORAL_CAPSULE | Freq: Two times a day (BID) | ORAL | Status: DC

## 2015-01-18 NOTE — ED Provider Notes (Signed)
CSN: [id number]     Arrival date & time 01/18/15  1718 History   First MD Initiated Contact with Patient 01/18/15 2039     Chief Complaint  Patient presents with  . Hemorrhoids    Patient is a 44 y.o. male presenting with male genitourinary complaint. The history is provided by the patient.  Male GU Problem Presenting symptoms: no dysuria   Presenting symptoms comment:  Rectal pain Relieved by:  Nothing Exacerbated by: Sitting down, having a bowel movement. Associated symptoms: no abdominal pain, no diarrhea, no fever, no flank pain, no hematuria, no nausea, no urinary incontinence and no vomiting     Past Medical History  Diagnosis Date  . Hypertension   . Hyperlipidemia   . History of nuclear stress test 03/29/2009    normal exam without evidence of exercise-induced sichemia  . Hemorrhoid    Past Surgical History  Procedure Laterality Date  . Transthoracic echocardiogram  05/25/2012    EF 55-60%, mild conc hypertrophy, normal systolic function; mild MR   Family History  Problem Relation Age of Onset  . Heart attack Father   . Hypertension Mother   . Hypertension Brother     also abdominal cancer   Social History  Substance Use Topics  . Smoking status: Never Smoker   . Smokeless tobacco: Never Used  . Alcohol Use: No    Review of Systems  Constitutional: Negative for fever and chills.  HENT: Negative for rhinorrhea and sore throat.   Eyes: Negative for visual disturbance.  Respiratory: Negative for cough and shortness of breath.   Cardiovascular: Negative for chest pain.  Gastrointestinal: Positive for rectal pain. Negative for nausea, vomiting, abdominal pain, diarrhea, constipation, blood in stool and anal bleeding.       Dyschezia  Genitourinary: Negative for bladder incontinence, dysuria, hematuria and flank pain.  Musculoskeletal: Negative for back pain and neck pain.  Skin: Negative for rash.  Neurological: Negative for syncope and headaches.   Psychiatric/Behavioral: Negative for confusion.  All other systems reviewed and are negative.  Allergies  Review of patient's allergies indicates no known allergies.  Home Medications   Prior to Admission medications   Medication Sig Start Date End Date Taking? Authorizing Provider  aspirin 81 MG tablet Take 81 mg by mouth daily.   Yes Historical Provider, MD  atorvastatin (LIPITOR) 40 MG tablet Take 1 tablet (40 mg total) by mouth daily. 08/13/12  Yes Terance Ice, MD  lisinopril (PRINIVIL,ZESTRIL) 40 MG tablet Take 40 mg by mouth daily.   Yes Historical Provider, MD  belladonna-opium (B&O SUPPRETTES) 16.2-30 MG suppository Place 1 suppository rectally every 8 (eight) hours as needed for pain. 01/18/15   Gustavus Bryant, MD  docusate sodium (COLACE) 50 MG capsule Take 1 capsule (50 mg total) by mouth 2 (two) times daily. 01/18/15   Gustavus Bryant, MD  esomeprazole (NEXIUM) 40 MG capsule Take 1 capsule (40 mg total) by mouth daily at 12 noon. Patient not taking: Reported on 01/18/2015 12/18/12   Pixie Casino, MD  HYDROcodone-acetaminophen (NORCO/VICODIN) 5-325 MG tablet Take 1 tablet by mouth every 4 (four) hours as needed for severe pain. 01/18/15   Gustavus Bryant, MD   BP 124/93 mmHg  Pulse 49  Temp(Src) 98.3 F (36.8 C) (Oral)  Resp 18  SpO2 99% Physical Exam  Constitutional: He is oriented to person, place, and time. He appears well-developed and well-nourished. No distress.  HENT:  Head: Normocephalic and atraumatic.  Mouth/Throat: Oropharynx is clear and moist.  Eyes: EOM are normal.  Neck: Neck supple. No JVD present.  Cardiovascular: Normal rate, regular rhythm, normal heart sounds and intact distal pulses.   Pulmonary/Chest: Effort normal and breath sounds normal.  Abdominal: Soft. He exhibits no distension. There is no tenderness.  Genitourinary: Rectal exam shows external hemorrhoid.  Musculoskeletal: Normal range of motion. He exhibits no edema.  Neurological: He  is alert and oriented to person, place, and time. No cranial nerve deficit.  Skin: Skin is warm and dry.  Psychiatric: His behavior is normal.    ED Course  Procedures  None    MDM   Final diagnoses:  Thrombosed external hemorrhoid    Patient is a 44 y/O AAM male with history of hypertension, hyperlipidemia and hemorrhoids presents with rectal pain which he attributes to his hemorrhoids. On exam he does have a firm thrombosed external hemorrhoid. Patient has been using sitz baths and rectal creams which are not improving his symptoms. Patient denies bloody stools, nausea, vomiting, abdominal pain. Patient declined ED management of his hemorrhoid and opted for outpatient management. We'll prescribe Norco, B&O suppository, and stool softener. Patient given follow-up information for general surgery and instructed to follow-up in 1-2 weeks if symptoms are not improving. Patient voiced understanding and is agreeable with this plan.  Discussed with Dr. Vanita Panda.   Gustavus Bryant, MD 01/18/15 [id number]  Carmin Muskrat, MD 01/19/15 [phone]

## 2015-01-18 NOTE — Discharge Instructions (Signed)
Hemorrhoids  Hemorrhoids are puffy (swollen) veins around the rectum or anus. Hemorrhoids can cause pain, itching, bleeding, or irritation.  HOME CARE  · Eat foods with fiber, such as whole grains, beans, nuts, fruits, and vegetables. Ask your doctor about taking products with added fiber in them (fiber supplements).   · Drink enough fluid to keep your pee (urine) clear or pale yellow.  · Exercise often.  · Go to the bathroom when you have the urge to poop. Do not wait.  · Avoid straining to poop (bowel movement).  · Keep the butt area dry and clean. Use wet toilet paper or moist paper towels.  · Medicated creams and medicine inserted into the anus (anal suppository) may be used or applied as told.  · Only take medicine as told by your doctor.  · Take a warm water bath (sitz bath) for 15-20 minutes to ease pain. Do this 3-4 times a day.  · Place ice packs on the area if it is tender or puffy. Use the ice packs between the warm water baths.    Put ice in a plastic bag.    Place a towel between your skin and the bag.    Leave the ice on for 15-20 minutes, 03-04 times a day.  · Do not use a donut-shaped pillow or sit on the toilet for a long time.  GET HELP RIGHT AWAY IF:   · You have more pain that is not controlled by treatment or medicine.  · You have bleeding that will not stop.  · You have trouble or are unable to poop (bowel movement).  · You have pain or puffiness outside the area of the hemorrhoids.  MAKE SURE YOU:   · Understand these instructions.  · Will watch your condition.  · Will get help right away if you are not doing well or get worse.     This information is not intended to replace advice given to you by your health care provider. Make sure you discuss any questions you have with your health care provider.     Document Released: 11/01/2007 Document Revised: 01/09/2012 Document Reviewed: 12/04/2011  Elsevier Interactive Patient Education ©2016 Elsevier Inc.

## 2015-01-18 NOTE — ED Notes (Signed)
Pt here because he states he has external hemorrhoids. He states they are very painful and bleeding. Preparation H has not helped.

## 2015-01-18 NOTE — ED Notes (Signed)
Dr. Kathi Simpers at the bedside.

## 2015-01-18 NOTE — ED Notes (Signed)
Dr.Lockwood at bedside  

## 2016-05-08 ENCOUNTER — Ambulatory Visit (INDEPENDENT_AMBULATORY_CARE_PROVIDER_SITE_OTHER): Payer: BLUE CROSS/BLUE SHIELD | Admitting: Family Medicine

## 2016-05-08 ENCOUNTER — Encounter: Payer: Self-pay | Admitting: Family Medicine

## 2016-05-08 VITALS — BP 136/90 | HR 55 | Resp 16 | Ht 66.5 in | Wt 197.6 lb

## 2016-05-08 DIAGNOSIS — Z7689 Persons encountering health services in other specified circumstances: Secondary | ICD-10-CM | POA: Diagnosis not present

## 2016-05-08 DIAGNOSIS — I10 Essential (primary) hypertension: Secondary | ICD-10-CM | POA: Diagnosis not present

## 2016-05-08 LAB — BASIC METABOLIC PANEL
BUN: 14 mg/dL (ref 7–25)
CHLORIDE: 107 mmol/L (ref 98–110)
CO2: 28 mmol/L (ref 20–31)
Calcium: 9.2 mg/dL (ref 8.6–10.3)
Creat: 1.42 mg/dL — ABNORMAL HIGH (ref 0.60–1.35)
Glucose, Bld: 103 mg/dL — ABNORMAL HIGH (ref 65–99)
POTASSIUM: 3.9 mmol/L (ref 3.5–5.3)
Sodium: 144 mmol/L (ref 135–146)

## 2016-05-08 NOTE — Progress Notes (Signed)
   Subjective:    Patient ID: Joshua Hickman, male    DOB: [birth year], 46 y.o.   MRN: [id number]  HPI Chief Complaint  Patient presents with  . new pt    new pt bp issues for about 20 years. on meds now   He is new to the practice and here to establish care. No concerns or complaints today.   Previous medical care: urgent cares, no PCP in years.   Last CPE: 18 months ago.   Other providers: none   Past medical history: HTN - reports being diagnosed approximately 20 years ago. States it runs in his family also.  Check his BP at home occasionally but has not checked it in the past few weeks.   Hyperlipidemia and has been taking a statin for the past 4 years.   Social history: Lives with wife and 1 son (age 3), works as a Architectural technologist.  Denies smoking, drinking alcohol, drug use Diet: unhealthy per patient  Exercise: walks some days   Reviewed allergies, medications, past medical, surgical, family, and social history.   Review of Systems Pertinent positives and negatives in the history of present illness.     Objective:   Physical Exam BP 136/90   Pulse (!) 55   Resp 16   Ht 5' 6.5" (1.689 m)   Wt 197 lb 9.6 oz (89.6 kg)   SpO2 98%   BMI 31.42 kg/m   Alert and in no distress.  Pharyngeal area is normal. Neck is supple without adenopathy or thyromegaly. Cardiac exam shows a regular sinus rhythm without murmurs or gallops. Lungs are clear to auscultation. DTRs normal and symmetric. Extremities without edema, normal pulses.       Assessment & Plan:  HYPERTENSION, BENIGN ESSENTIAL - Plan: Basic metabolic panel  Encounter to establish care  Discussed that his BP is not within goal range. Recommend that he start checking his BP daily for the next 2 weeks and call me with his readings. Discussed healthy diet modifications and exercise. No changes to medications today but if his readings are not within goal in 2 weeks will consider making changes.  Return in 4  weeks to address HTN and to check fasting lipids. He may also want to do a CPE on that date and that will be fine as well.

## 2016-05-08 NOTE — Patient Instructions (Signed)
Start checking your BP daily for the next 2 weeks.  Goal BP range is <130/80 If you are seeing readings higher than this on a regular basis then your BP is not well controlled and you will need to come in and talk about possibly adjusting your medication.  Call with your readings in 2 weeks and return in 4 weeks for a follow up appointment.  We could also do a physical exam on the same day and check your cholesterol if you come in fasting (nothing to eat or drink for 6 hours)   DASH Eating Plan DASH stands for "Dietary Approaches to Stop Hypertension." The DASH eating plan is a healthy eating plan that has been shown to reduce high blood pressure (hypertension). It may also reduce your risk for type 2 diabetes, heart disease, and stroke. The DASH eating plan may also help with weight loss. What are tips for following this plan? General guidelines   Avoid eating more than 2,300 mg (milligrams) of salt (sodium) a day. If you have hypertension, you may need to reduce your sodium intake to 1,500 mg a day.  Limit alcohol intake to no more than 1 drink a day for nonpregnant women and 2 drinks a day for men. One drink equals 12 oz of beer, 5 oz of wine, or 1 oz of hard liquor.  Work with your health care provider to maintain a healthy body weight or to lose weight. Ask what an ideal weight is for you.  Get at least 30 minutes of exercise that causes your heart to beat faster (aerobic exercise) most days of the week. Activities may include walking, swimming, or biking.  Work with your health care provider or diet and nutrition specialist (dietitian) to adjust your eating plan to your individual calorie needs. Reading food labels   Check food labels for the amount of sodium per serving. Choose foods with less than 5 percent of the Daily Value of sodium. Generally, foods with less than 300 mg of sodium per serving fit into this eating plan.  To find whole grains, look for the word "whole" as the  first word in the ingredient list. Shopping   Buy products labeled as "low-sodium" or "no salt added."  Buy fresh foods. Avoid canned foods and premade or frozen meals. Cooking   Avoid adding salt when cooking. Use salt-free seasonings or herbs instead of table salt or sea salt. Check with your health care provider or pharmacist before using salt substitutes.  Do not fry foods. Cook foods using healthy methods such as baking, boiling, grilling, and broiling instead.  Cook with heart-healthy oils, such as olive, canola, soybean, or sunflower oil. Meal planning    Eat a balanced diet that includes:  5 or more servings of fruits and vegetables each day. At each meal, try to fill half of your plate with fruits and vegetables.  Up to 6-8 servings of whole grains each day.  Less than 6 oz of lean meat, poultry, or fish each day. A 3-oz serving of meat is about the same size as a deck of cards. One egg equals 1 oz.  2 servings of low-fat dairy each day.  A serving of nuts, seeds, or beans 5 times each week.  Heart-healthy fats. Healthy fats called Omega-3 fatty acids are found in foods such as flaxseeds and coldwater fish, like sardines, salmon, and mackerel.  Limit how much you eat of the following:  Canned or prepackaged foods.  Food that is high  in trans fat, such as fried foods.  Food that is high in saturated fat, such as fatty meat.  Sweets, desserts, sugary drinks, and other foods with added sugar.  Full-fat dairy products.  Do not salt foods before eating.  Try to eat at least 2 vegetarian meals each week.  Eat more home-cooked food and less restaurant, buffet, and fast food.  When eating at a restaurant, ask that your food be prepared with less salt or no salt, if possible. What foods are recommended? The items listed may not be a complete list. Talk with your dietitian about what dietary choices are best for you. Grains  Whole-grain or whole-wheat bread.  Whole-grain or whole-wheat pasta. Brown rice. Modena Morrow. Bulgur. Whole-grain and low-sodium cereals. Pita bread. Low-fat, low-sodium crackers. Whole-wheat flour tortillas. Vegetables  Fresh or frozen vegetables (raw, steamed, roasted, or grilled). Low-sodium or reduced-sodium tomato and vegetable juice. Low-sodium or reduced-sodium tomato sauce and tomato paste. Low-sodium or reduced-sodium canned vegetables. Fruits  All fresh, dried, or frozen fruit. Canned fruit in natural juice (without added sugar). Meat and other protein foods  Skinless chicken or [place]. Ground chicken or [place]. Pork with fat trimmed off. Fish and seafood. Egg whites. Dried beans, peas, or lentils. Unsalted nuts, nut butters, and seeds. Unsalted canned beans. Lean cuts of beef with fat trimmed off. Low-sodium, lean deli meat. Dairy  Low-fat (1%) or fat-free (skim) milk. Fat-free, low-fat, or reduced-fat cheeses. Nonfat, low-sodium ricotta or cottage cheese. Low-fat or nonfat yogurt. Low-fat, low-sodium cheese. Fats and oils  Soft margarine without trans fats. Vegetable oil. Low-fat, reduced-fat, or light mayonnaise and salad dressings (reduced-sodium). Canola, safflower, olive, soybean, and sunflower oils. Avocado. Seasoning and other foods  Herbs. Spices. Seasoning mixes without salt. Unsalted popcorn and pretzels. Fat-free sweets. What foods are not recommended? The items listed may not be a complete list. Talk with your dietitian about what dietary choices are best for you. Grains  Baked goods made with fat, such as croissants, muffins, or some breads. Dry pasta or rice meal packs. Vegetables  Creamed or fried vegetables. Vegetables in a cheese sauce. Regular canned vegetables (not low-sodium or reduced-sodium). Regular canned tomato sauce and paste (not low-sodium or reduced-sodium). Regular tomato and vegetable juice (not low-sodium or reduced-sodium). Angie Fava. Olives. Fruits  Canned fruit in a light or heavy  syrup. Fried fruit. Fruit in cream or butter sauce. Meat and other protein foods  Fatty cuts of meat. Ribs. Fried meat. Berniece Salines. Sausage. Bologna and other processed lunch meats. Salami. Fatback. Hotdogs. Bratwurst. Salted nuts and seeds. Canned beans with added salt. Canned or smoked fish. Whole eggs or egg yolks. Chicken or [place] with skin. Dairy  Whole or 2% milk, cream, and half-and-half. Whole or full-fat cream cheese. Whole-fat or sweetened yogurt. Full-fat cheese. Nondairy creamers. Whipped toppings. Processed cheese and cheese spreads. Fats and oils  Butter. Stick margarine. Lard. Shortening. Ghee. Bacon fat. Tropical oils, such as coconut, palm kernel, or palm oil. Seasoning and other foods  Salted popcorn and pretzels. Onion salt, garlic salt, seasoned salt, table salt, and sea salt. Worcestershire sauce. Tartar sauce. Barbecue sauce. Teriyaki sauce. Soy sauce, including reduced-sodium. Steak sauce. Canned and packaged gravies. Fish sauce. Oyster sauce. Cocktail sauce. Horseradish that you find on the shelf. Ketchup. Mustard. Meat flavorings and tenderizers. Bouillon cubes. Hot sauce and Tabasco sauce. Premade or packaged marinades. Premade or packaged taco seasonings. Relishes. Regular salad dressings. Where to find more information:  National Heart, Lung, and Blood Institute: https://wilson-eaton.com/  American  Heart Association: www.heart.org Summary  The DASH eating plan is a healthy eating plan that has been shown to reduce high blood pressure (hypertension). It may also reduce your risk for type 2 diabetes, heart disease, and stroke.  With the DASH eating plan, you should limit salt (sodium) intake to 2,300 mg a day. If you have hypertension, you may need to reduce your sodium intake to 1,500 mg a day.  When on the DASH eating plan, aim to eat more fresh fruits and vegetables, whole grains, lean proteins, low-fat dairy, and heart-healthy fats.  Work with your health care provider or diet  and nutrition specialist (dietitian) to adjust your eating plan to your individual calorie needs. This information is not intended to replace advice given to you by your health care provider. Make sure you discuss any questions you have with your health care provider. Document Released: 01/11/2011 Document Revised: 01/16/2016 Document Reviewed: 01/16/2016 Elsevier Interactive Patient Education  2017 [employer].

## 2016-06-11 ENCOUNTER — Encounter: Payer: Self-pay | Admitting: Family Medicine

## 2016-06-11 ENCOUNTER — Ambulatory Visit (INDEPENDENT_AMBULATORY_CARE_PROVIDER_SITE_OTHER): Payer: BLUE CROSS/BLUE SHIELD | Admitting: Family Medicine

## 2016-06-11 VITALS — BP 120/82 | HR 56 | Ht 66.75 in | Wt 195.6 lb

## 2016-06-11 DIAGNOSIS — Z0000 Encounter for general adult medical examination without abnormal findings: Secondary | ICD-10-CM

## 2016-06-11 DIAGNOSIS — Z113 Encounter for screening for infections with a predominantly sexual mode of transmission: Secondary | ICD-10-CM

## 2016-06-11 DIAGNOSIS — Z79899 Other long term (current) drug therapy: Secondary | ICD-10-CM

## 2016-06-11 DIAGNOSIS — R319 Hematuria, unspecified: Secondary | ICD-10-CM

## 2016-06-11 DIAGNOSIS — E785 Hyperlipidemia, unspecified: Secondary | ICD-10-CM

## 2016-06-11 DIAGNOSIS — I10 Essential (primary) hypertension: Secondary | ICD-10-CM | POA: Diagnosis not present

## 2016-06-11 DIAGNOSIS — Z23 Encounter for immunization: Secondary | ICD-10-CM | POA: Diagnosis not present

## 2016-06-11 LAB — POCT URINALYSIS DIPSTICK
BILIRUBIN UA: NEGATIVE
Glucose, UA: NEGATIVE
KETONES UA: NEGATIVE
LEUKOCYTES UA: NEGATIVE
Nitrite, UA: NEGATIVE
PROTEIN UA: NEGATIVE
Spec Grav, UA: >=1.03 — AB (ref 1.010–1.025)
Urobilinogen, UA: 0.2 E.U./dL
pH, UA: 6 (ref 5.0–8.0)

## 2016-06-11 LAB — LIPID PANEL
CHOL/HDL RATIO: 3.6 ratio (ref <5.0)
CHOLESTEROL: 153 mg/dL (ref <200)
HDL: 42 mg/dL (ref >40)
LDL Cholesterol: 98 mg/dL (ref <100)
Triglycerides: 66 mg/dL (ref <150)
VLDL: 13 mg/dL (ref <30)

## 2016-06-11 LAB — BASIC METABOLIC PANEL
BUN: 16 mg/dL (ref 7–25)
CO2: 27 mmol/L (ref 20–31)
Calcium: 9.3 mg/dL (ref 8.6–10.3)
Chloride: 107 mmol/L (ref 98–110)
Creat: 1.55 mg/dL — ABNORMAL HIGH (ref 0.60–1.35)
GLUCOSE: 95 mg/dL (ref 65–99)
POTASSIUM: 4.3 mmol/L (ref 3.5–5.3)
Sodium: 145 mmol/L (ref 135–146)

## 2016-06-11 LAB — CBC WITH DIFFERENTIAL/PLATELET
BASOS ABS: 41 {cells}/uL (ref 0–200)
Basophils Relative: 1 %
EOS ABS: 82 {cells}/uL (ref 15–500)
Eosinophils Relative: 2 %
HCT: 46.3 % (ref 38.5–50.0)
HEMOGLOBIN: 15.5 g/dL (ref 13.2–17.1)
LYMPHS ABS: 1599 {cells}/uL (ref 850–3900)
Lymphocytes Relative: 39 %
MCH: 29.8 pg (ref 27.0–33.0)
MCHC: 33.5 g/dL (ref 32.0–36.0)
MCV: 89 fL (ref 80.0–100.0)
MPV: 10.3 fL (ref 7.5–12.5)
Monocytes Absolute: 328 cells/uL (ref 200–950)
Monocytes Relative: 8 %
NEUTROS ABS: 2050 {cells}/uL (ref 1500–7800)
Neutrophils Relative %: 50 %
Platelets: 263 10*3/uL (ref 140–400)
RBC: 5.2 MIL/uL (ref 4.20–5.80)
RDW: 13.8 % (ref 11.0–15.0)
WBC: 4.1 10*3/uL (ref 4.0–10.5)

## 2016-06-11 LAB — TSH: TSH: 1.64 mIU/L (ref 0.40–4.50)

## 2016-06-11 NOTE — Patient Instructions (Signed)
You received a Tdap (Tetanus shot) today.  Call and schedule a dental cleaning at your convenience.  We will call you with lab results.   Preventative Care for Adults, Male       REGULAR HEALTH EXAMS:  A routine yearly physical is a good way to check in with your primary care provider about your health and preventive screening. It is also an opportunity to share updates about your health and any concerns you have, and receive a thorough all-over exam.   Most health insurance companies pay for at least some preventative services.  Check with your health plan for specific coverages.  WHAT PREVENTATIVE SERVICES DO MEN NEED?  Adult men should have their weight and blood pressure checked regularly.   Men age 60 and older should have their cholesterol levels checked regularly.  Beginning at age 28 and continuing to age 70, men should be screened for colorectal cancer.  Certain people should may need continued testing until age 17.  Other cancer screening may include exams for testicular and prostate cancer.  Updating vaccinations is part of preventative care.  Vaccinations help protect against diseases such as the flu.  Lab tests are generally done as part of preventative care to screen for anemia and blood disorders, to screen for problems with the kidneys and liver, to screen for bladder problems, to check blood sugar, and to check your cholesterol level.  Preventative services generally include counseling about diet, exercise, avoiding tobacco, drugs, excessive alcohol consumption, and sexually transmitted infections.    GENERAL RECOMMENDATIONS FOR GOOD HEALTH:  Healthy diet:  Eat a variety of foods, including fruit, vegetables, animal or vegetable protein, such as meat, fish, chicken, and eggs, or beans, lentils, tofu, and grains, such as rice.  Drink plenty of water daily.  Decrease saturated fat in the diet, avoid lots of red meat, processed foods, sweets, fast foods, and fried  foods.  Exercise:  Aerobic exercise helps maintain good heart health. At least 30-40 minutes of moderate-intensity exercise is recommended. For example, a brisk walk that increases your heart rate and breathing. This should be done on most days of the week.   Find a type of exercise or a variety of exercises that you enjoy so that it becomes a part of your daily life.  Examples are running, walking, swimming, water aerobics, and biking.  For motivation and support, explore group exercise such as aerobic class, spin class, Zumba, Yoga,or  martial arts, etc.    Set exercise goals for yourself, such as a certain weight goal, walk or run in a race such as a 5k walk/run.  Speak to your primary care provider about exercise goals.  Disease prevention:  If you smoke or chew tobacco, find out from your caregiver how to quit. It can literally save your life, no matter how long you have been a tobacco user. If you do not use tobacco, never begin.   Maintain a healthy diet and normal weight. Increased weight leads to problems with blood pressure and diabetes.   The Body Mass Index or BMI is a way of measuring how much of your body is fat. Having a BMI above 27 increases the risk of heart disease, diabetes, hypertension, stroke and other problems related to obesity. Your caregiver can help determine your BMI and based on it develop an exercise and dietary program to help you achieve or maintain this important measurement at a healthful level.  High blood pressure causes heart and blood vessel problems.  Persistent high blood pressure should be treated with medicine if weight loss and exercise do not work.   Fat and cholesterol leaves deposits in your arteries that can block them. This causes heart disease and vessel disease elsewhere in your body.  If your cholesterol is found to be high, or if you have heart disease or certain other medical conditions, then you may need to have your cholesterol monitored  frequently and be treated with medication.   Ask if you should have a stress test if your history suggests this. A stress test is a test done on a treadmill that looks for heart disease. This test can find disease prior to there being a problem.  Avoid drinking alcohol in excess (more than two drinks per day).  Avoid use of street drugs. Do not share needles with anyone. Ask for professional help if you need assistance or instructions on stopping the use of alcohol, cigarettes, and/or drugs.  Brush your teeth twice a day with fluoride toothpaste, and floss once a day. Good oral hygiene prevents tooth decay and gum disease. The problems can be painful, unattractive, and can cause other health problems. Visit your dentist for a routine oral and dental check up and preventive care every 6-12 months.   Look at your skin regularly.  Use a mirror to look at your back. Notify your caregivers of changes in moles, especially if there are changes in shapes, colors, a size larger than a pencil eraser, an irregular border, or development of new moles.  Safety:  Use seatbelts 100% of the time, whether driving or as a passenger.  Use safety devices such as hearing protection if you work in environments with loud noise or significant background noise.  Use safety glasses when doing any work that could send debris in to the eyes.  Use a helmet if you ride a bike or motorcycle.  Use appropriate safety gear for contact sports.  Talk to your caregiver about gun safety.  Use sunscreen with a SPF (or skin protection factor) of 15 or greater.  Lighter skinned people are at a greater risk of skin cancer. Don't forget to also wear sunglasses in order to protect your eyes from too much damaging sunlight. Damaging sunlight can accelerate cataract formation.   Practice safe sex. Use condoms. Condoms are used for birth control and to help reduce the spread of sexually transmitted infections (or STIs).  Some of the STIs are  gonorrhea (the clap), chlamydia, syphilis, trichomonas, herpes, HPV (human papilloma virus) and HIV (human immunodeficiency virus) which causes AIDS. The herpes, HIV and HPV are viral illnesses that have no cure. These can result in disability, cancer and death.   Keep carbon monoxide and smoke detectors in your home functioning at all times. Change the batteries every 6 months or use a model that plugs into the wall.   Vaccinations:  Stay up to date with your tetanus shots and other required immunizations. You should have a booster for tetanus every 10 years. Be sure to get your flu shot every year, since 5%-20% of the [place] population comes down with the flu. The flu vaccine changes each year, so being vaccinated once is not enough. Get your shot in the fall, before the flu season peaks.   Other vaccines to consider:  Pneumococcal vaccine to protect against certain types of pneumonia.  This is normally recommended for adults age 90 or older.  However, adults younger than 48 years old with certain underlying conditions such as  diabetes, heart or lung disease should also receive the vaccine.  Shingles vaccine to protect against Varicella Zoster if you are older than age 7, or younger than 48 years old with certain underlying illness.  Hepatitis A vaccine to protect against a form of infection of the liver by a virus acquired from food.  Hepatitis B vaccine to protect against a form of infection of the liver by a virus acquired from blood or body fluids, particularly if you work in health care.  If you plan to travel internationally, check with your local health department for specific vaccination recommendations.  Cancer Screening:  Most routine colon cancer screening begins at the age of 3. On a yearly basis, doctors may provide special easy to use take-home tests to check for hidden blood in the stool. Sigmoidoscopy or colonoscopy can detect the earliest forms of colon cancer and is life  saving. These tests use a small camera at the end of a tube to directly examine the colon. Speak to your caregiver about this at age 80, when routine screening begins (and is repeated every 5 years unless early forms of pre-cancerous polyps or small growths are found).   At the age of 76 men usually start screening for prostate cancer every year. Screening may begin at a younger age for those with higher risk. Those at higher risk include African-Americans or having a family history of prostate cancer. There are two types of tests for prostate cancer:   Prostate-specific antigen (PSA) testing. Recent studies raise questions about prostate cancer using PSA and you should discuss this with your caregiver.   Digital rectal exam (in which your doctor's lubricated and gloved finger feels for enlargement of the prostate through the anus).   Screening for testicular cancer.  Do a monthly exam of your testicles. Gently roll each testicle between your thumb and fingers, feeling for any abnormal lumps. The best time to do this is after a hot shower or bath when the tissues are looser. Notify your caregivers of any lumps, tenderness or changes in size or shape immediately.

## 2016-06-11 NOTE — Progress Notes (Signed)
Subjective:    Patient ID: Joshua Hickman, male    DOB: September 04, 1970, 46 y.o.   MRN: [id number]  HPI Chief Complaint  Patient presents with  . cpe    fasting cpe. bo running high   He is here for a complete physical exam. No concerns or complaints today.   Last CPE: 19 months ago.   Other providers: none  Past medical history: HTN- has not been checking his BP at home. Reports good medication compliance.  States he was diagnosed with hyperlipidemia approximately 4 years ago and has been taking a statin.   Denies smoking, drinking alcohol, drug use Diet: fairly unhealthy, states he cut back on soda.  Exercise: walks at work but not otherwise.   Immunizations: unknown last Tdap  Health maintenance:  Colonoscopy: N/A  Last PSA: N/A Last Dental Exam: 4 years ago.  Last Eye Exam: has reading glasses. 4 years ago.   Wears seatbelt always, smoke detectors in home and functioning, does not text while driving, feels safe in home environment.  Reviewed allergies, medications, past medical, surgical, family, and social history.   Review of Systems Review of Systems Constitutional: -fever, -chills, -sweats, -unexpected weight change,-fatigue ENT: -runny nose, -ear pain, -sore throat Cardiology:  -chest pain, -palpitations, -edema Respiratory: -cough, -shortness of breath, -wheezing Gastroenterology: -abdominal pain, -nausea, -vomiting, -diarrhea, -constipation  Hematology: -bleeding or bruising problems Musculoskeletal: -arthralgias, -myalgias, -joint swelling, -back pain Ophthalmology: -vision changes Urology: -dysuria, -difficulty urinating, -hematuria, -urinary frequency, -urgency Neurology: -headache, -weakness, -tingling, -numbness       Objective:   Physical Exam BP 120/82   Pulse (!) 56   Ht 5' 6.75" (1.695 m)   Wt 195 lb 9.6 oz (88.7 kg)   BMI 30.87 kg/m   General Appearance:    Alert, cooperative, no distress, appears stated age  Head:    Normocephalic,  without obvious abnormality, atraumatic  Eyes:    PERRL, conjunctiva/corneas clear, EOM's intact, fundi    benign  Ears:    Normal TM's and external ear canals  Nose:   Nares normal, mucosa normal, no drainage or sinus   tenderness  Throat:   Lips, mucosa, and tongue normal; teeth and gums normal  Neck:   Supple, no lymphadenopathy;  thyroid:  no   enlargement/tenderness/nodules; no carotid   bruit or JVD  Back:    Spine nontender, no curvature, ROM normal, no CVA     tenderness  Lungs:     Clear to auscultation bilaterally without wheezes, rales or     ronchi; respirations unlabored  Chest Wall:    No tenderness or deformity   Heart:    Regular rate and rhythm, S1 and S2 normal, no murmur, rub   or gallop  Breast Exam:    No chest wall tenderness, masses or gynecomastia  Abdomen:     Soft, non-tender, nondistended, normoactive bowel sounds,    no masses, no hepatosplenomegaly  Genitalia:    Refused.   Rectal:    Refused.   Extremities:   No clubbing, cyanosis or edema  Pulses:   2+ and symmetric all extremities  Skin:   Skin color, texture, turgor normal, no rashes or lesions  Lymph nodes:   Cervical, supraclavicular, and axillary nodes normal  Neurologic:   CNII-XII intact, normal strength, sensation and gait; reflexes 2+ and symmetric throughout          Psych:   Normal mood, affect, hygiene and grooming.    Urinalysis dipstick: spec grav 1.030,  trace blood       Assessment & Plan:  Routine general medical examination at a health care facility - Plan: CBC with Differential/Platelet, POCT urinalysis dipstick, TSH, Basic metabolic panel  HYPERTENSION, BENIGN ESSENTIAL - Plan: Basic metabolic panel  Medication management - Plan: Lipid panel  Hyperlipidemia, unspecified hyperlipidemia type - Plan: Lipid panel  Need for Tdap vaccination - Plan: Tdap vaccine greater than or equal to 7yo IM  Screen for STD (sexually transmitted disease) - Plan: RPR, GC/Chlamydia Probe Amp, HIV  antibody  Hematuria, unspecified type - Plan: Urine Microscopic  Discussed that he appears to be doing well overall.  Blood pressure is within goal today. Continue on current medication. Counseled on low sodium diet and exercise.  Hyperlipidemia- will check fasting lipids. He will continue taking statin. Lifestyle modifications discussed.  Tdap given. STD testing done per patient request.  Will send urine for microscopic to rule out hematuria.  Refused genital exam, advised self testicular exams.  Follow up pending labs.

## 2016-06-12 ENCOUNTER — Encounter: Payer: Self-pay | Admitting: Family Medicine

## 2016-06-12 DIAGNOSIS — N189 Chronic kidney disease, unspecified: Secondary | ICD-10-CM | POA: Insufficient documentation

## 2016-06-12 LAB — GC/CHLAMYDIA PROBE AMP
CT Probe RNA: NOT DETECTED
GC Probe RNA: NOT DETECTED

## 2016-06-12 LAB — RPR

## 2016-06-12 LAB — URINALYSIS, MICROSCOPIC ONLY
BACTERIA UA: NONE SEEN [HPF]
CASTS: NONE SEEN [LPF]
CRYSTALS: NONE SEEN [HPF]
Squamous Epithelial / LPF: NONE SEEN [HPF] (ref <=5)
WBC, UA: NONE SEEN WBC/HPF (ref <=5)
YEAST: NONE SEEN [HPF]

## 2016-06-12 LAB — HIV ANTIBODY (ROUTINE TESTING W REFLEX): HIV 1&2 Ab, 4th Generation: NONREACTIVE

## 2016-08-06 ENCOUNTER — Telehealth: Payer: Self-pay | Admitting: Family Medicine

## 2016-08-06 MED ORDER — LISINOPRIL 40 MG PO TABS: 40.0000 mg | ORAL_TABLET | Freq: Every day | ORAL | 2 refills | Status: DC

## 2016-08-06 MED ORDER — ATORVASTATIN CALCIUM 40 MG PO TABS: 40.0000 mg | ORAL_TABLET | Freq: Every day | ORAL | 2 refills | Status: DC

## 2016-08-06 NOTE — Telephone Encounter (Signed)
Pt called and stated he needs refills of meds. We have never refilled these for him as he is a new pt. Pt needs atorvastatin and lisinopril sent to [facility]. He can be reached at [phone].

## 2016-08-06 NOTE — Telephone Encounter (Signed)
Ok to refill for 3 months and then he will need a visit. Will need repeat labs and urine for kidney function.  Let him know. Thanks.

## 2016-08-06 NOTE — Telephone Encounter (Signed)
Pt already has an appt for 8/20 from a 3 month follow-up on his labs and bp.  Refilled meds for 3 months

## 2016-09-24 ENCOUNTER — Encounter: Payer: Self-pay | Admitting: Family Medicine

## 2016-09-24 ENCOUNTER — Ambulatory Visit (INDEPENDENT_AMBULATORY_CARE_PROVIDER_SITE_OTHER): Payer: BLUE CROSS/BLUE SHIELD | Admitting: Family Medicine

## 2016-09-24 VITALS — BP 140/90 | HR 53 | Wt 192.0 lb

## 2016-09-24 DIAGNOSIS — R51 Headache: Secondary | ICD-10-CM | POA: Diagnosis not present

## 2016-09-24 DIAGNOSIS — I10 Essential (primary) hypertension: Secondary | ICD-10-CM

## 2016-09-24 DIAGNOSIS — R519 Headache, unspecified: Secondary | ICD-10-CM

## 2016-09-24 DIAGNOSIS — N189 Chronic kidney disease, unspecified: Secondary | ICD-10-CM

## 2016-09-24 NOTE — Progress Notes (Signed)
   Subjective:    Patient ID: Joshua Hickman, male    DOB: 1970/03/23, 46 y.o.   MRN: [id number]  HPI Chief Complaint  Patient presents with  . 3 month follow-up    3 month follow-up on bp and kidney function. not checking bps outside of office   He is here for a 3 month follow up on HTN. States he does not check his BP outside of here, states he does not have time. States he has not taken his BP medication today and that he takes his medication at 7:30 pm daily. Denies any missed doses.   States he has been eating out a lot at [employer]. States he has decreased the amount of greasy foods he eats overall.  He does not exercise. States he does not have time.  New complaint of headache upon awakening in the mornings for the past 2 weeks. 5 days over the past 2 weeks he has awakened with a dull headache. States he takes Excedrin and this helps significantly. He reports having a hstory of headache in his sleep or in the mornings.  States he clinches his teeth at night. His gums are swollen sometimes. States he does not have a dentist and last dental exam was more than 2 years ago.  He snores but no episodes of apnea. Denies daytime sleepiness. No history of sleep apnea.   Denies numbness, tingling, weakness, dizziness, vision changes, chest pain, palpitations, shortness of breath, abdominal pain, N/V/D.   Reviewed allergies, medications, past medical, surgical,  and social history.   Review of Systems Pertinent positives and negatives in the history of present illness.     Objective:   Physical Exam BP 140/90   Pulse (!) 53   Wt 192 lb (87.1 kg)   BMI 30.30 kg/m   Alert and oriented and in no acute distress. Not otherwise examined.      Assessment & Plan:  HYPERTENSION, BENIGN ESSENTIAL - Plan: COMPLETE METABOLIC PANEL WITH GFR  Chronic kidney disease, unspecified CKD stage - Plan: COMPLETE METABOLIC PANEL WITH GFR, Microalbumin / creatinine urine ratio  Morning  headache  Discussed that his BP is not at goal today and that it would be helpful if he would check his BP outside of here for the next 2 weeks and call me with his readings. We may need to adjust his medications if his BP is not at goal.  He has not made any dietary changes since our last visit.Counseled on diet, low sodium, and increasing exercise. Encouraged him to make his health a priority. Advised that uncontrolled BP puts him at risk of worsening health including worsening kidney function. He verbalized understanding that he does have CKD.  His cell phone rang several times throughout the visit and he appeared distracted.  Recommend he see a dentist for an exam due to grinding his teeth at night and waking up with headaches. No sign of migraines.  Epworth sleepiness is 2. No sign of sleep apnea.  He will call me in 2 weeks with his BP readings and I will follow up pending labs.

## 2016-09-24 NOTE — Patient Instructions (Addendum)
Start checking your blood pressure outside of here for the next 2 weeks daily and let me know what your readings have been in 2 weeks. If your BP is not less than 130/80 we may need to consider adding a second medication.  Uncontrolled BP can cause worsening kidney function.   Cut back on salt and increase your physical activity.  We will call you with your lab results.   Call and schedule a dental exam as discussed. Make sure you are staying well hydrated and not skipping meals.  Sometimes dehydration or fluctuations in caffeine intake can cause headaches.  If your headaches continue then let me know.     DASH Eating Plan DASH stands for "Dietary Approaches to Stop Hypertension." The DASH eating plan is a healthy eating plan that has been shown to reduce high blood pressure (hypertension). It may also reduce your risk for type 2 diabetes, heart disease, and stroke. The DASH eating plan may also help with weight loss. What are tips for following this plan? General guidelines  Avoid eating more than 2,300 mg (milligrams) of salt (sodium) a day. If you have hypertension, you may need to reduce your sodium intake to 1,500 mg a day.  Limit alcohol intake to no more than 1 drink a day for nonpregnant women and 2 drinks a day for men. One drink equals 12 oz of beer, 5 oz of wine, or 1 oz of hard liquor.  Work with your health care provider to maintain a healthy body weight or to lose weight. Ask what an ideal weight is for you.  Get at least 30 minutes of exercise that causes your heart to beat faster (aerobic exercise) most days of the week. Activities may include walking, swimming, or biking.  Work with your health care provider or diet and nutrition specialist (dietitian) to adjust your eating plan to your individual calorie needs. Reading food labels  Check food labels for the amount of sodium per serving. Choose foods with less than 5 percent of the Daily Value of sodium. Generally, foods  with less than 300 mg of sodium per serving fit into this eating plan.  To find whole grains, look for the word "whole" as the first word in the ingredient list. Shopping  Buy products labeled as "low-sodium" or "no salt added."  Buy fresh foods. Avoid canned foods and premade or frozen meals. Cooking  Avoid adding salt when cooking. Use salt-free seasonings or herbs instead of table salt or sea salt. Check with your health care provider or pharmacist before using salt substitutes.  Do not fry foods. Cook foods using healthy methods such as baking, boiling, grilling, and broiling instead.  Cook with heart-healthy oils, such as olive, canola, soybean, or sunflower oil. Meal planning   Eat a balanced diet that includes: ? 5 or more servings of fruits and vegetables each day. At each meal, try to fill half of your plate with fruits and vegetables. ? Up to 6-8 servings of whole grains each day. ? Less than 6 oz of lean meat, poultry, or fish each day. A 3-oz serving of meat is about the same size as a deck of cards. One egg equals 1 oz. ? 2 servings of low-fat dairy each day. ? A serving of nuts, seeds, or beans 5 times each week. ? Heart-healthy fats. Healthy fats called Omega-3 fatty acids are found in foods such as flaxseeds and coldwater fish, like sardines, salmon, and mackerel.  Limit how much you eat  of the following: ? Canned or prepackaged foods. ? Food that is high in trans fat, such as fried foods. ? Food that is high in saturated fat, such as fatty meat. ? Sweets, desserts, sugary drinks, and other foods with added sugar. ? Full-fat dairy products.  Do not salt foods before eating.  Try to eat at least 2 vegetarian meals each week.  Eat more home-cooked food and less restaurant, buffet, and fast food.  When eating at a restaurant, ask that your food be prepared with less salt or no salt, if possible. What foods are recommended? The items listed may not be a complete  list. Talk with your dietitian about what dietary choices are best for you. Grains Whole-grain or whole-wheat bread. Whole-grain or whole-wheat pasta. Brown rice. [place]. Bulgur. Whole-grain and low-sodium cereals. Pita bread. Low-fat, low-sodium crackers. Whole-wheat flour tortillas. Vegetables Fresh or frozen vegetables (raw, steamed, roasted, or grilled). Low-sodium or reduced-sodium tomato and vegetable juice. Low-sodium or reduced-sodium tomato sauce and tomato paste. Low-sodium or reduced-sodium canned vegetables. Fruits All fresh, dried, or frozen fruit. Canned fruit in natural juice (without added sugar). Meat and other protein foods Skinless chicken or [place]. Ground chicken or [place]. Pork with fat trimmed off. Fish and seafood. Egg whites. Dried beans, peas, or lentils. Unsalted nuts, nut butters, and seeds. Unsalted canned beans. Lean cuts of beef with fat trimmed off. Low-sodium, lean deli meat. Dairy Low-fat (1%) or fat-free (skim) milk. Fat-free, low-fat, or reduced-fat cheeses. Nonfat, low-sodium ricotta or cottage cheese. Low-fat or nonfat yogurt. Low-fat, low-sodium cheese. Fats and oils Soft margarine without trans fats. Vegetable oil. Low-fat, reduced-fat, or light mayonnaise and salad dressings (reduced-sodium). Canola, safflower, olive, soybean, and sunflower oils. Avocado. Seasoning and other foods Herbs. Spices. Seasoning mixes without salt. Unsalted popcorn and pretzels. Fat-free sweets. What foods are not recommended? The items listed may not be a complete list. Talk with your dietitian about what dietary choices are best for you. Grains Baked goods made with fat, such as croissants, muffins, or some breads. Dry pasta or rice meal packs. Vegetables Creamed or fried vegetables. Vegetables in a cheese sauce. Regular canned vegetables (not low-sodium or reduced-sodium). Regular canned tomato sauce and paste (not low-sodium or reduced-sodium). Regular tomato and  vegetable juice (not low-sodium or reduced-sodium). Angie Fava. Olives. Fruits Canned fruit in a light or heavy syrup. Fried fruit. Fruit in cream or butter sauce. Meat and other protein foods Fatty cuts of meat. Ribs. Fried meat. Berniece Salines. Sausage. Bologna and other processed lunch meats. Salami. Fatback. Hotdogs. Bratwurst. Salted nuts and seeds. Canned beans with added salt. Canned or smoked fish. Whole eggs or egg yolks. Chicken or [place] with skin. Dairy Whole or 2% milk, cream, and half-and-half. Whole or full-fat cream cheese. Whole-fat or sweetened yogurt. Full-fat cheese. Nondairy creamers. Whipped toppings. Processed cheese and cheese spreads. Fats and oils Butter. Stick margarine. Lard. Shortening. Ghee. Bacon fat. Tropical oils, such as coconut, palm kernel, or palm oil. Seasoning and other foods Salted popcorn and pretzels. Onion salt, garlic salt, seasoned salt, table salt, and sea salt. Worcestershire sauce. Tartar sauce. Barbecue sauce. Teriyaki sauce. Soy sauce, including reduced-sodium. Steak sauce. Canned and packaged gravies. Fish sauce. Oyster sauce. Cocktail sauce. Horseradish that you find on the shelf. Ketchup. Mustard. Meat flavorings and tenderizers. Bouillon cubes. Hot sauce and Tabasco sauce. Premade or packaged marinades. Premade or packaged taco seasonings. Relishes. Regular salad dressings. Where to find more information:  National Heart, Lung, and Blood Institute: https://wilson-eaton.com/  American Heart  Association: www.heart.org Summary  The DASH eating plan is a healthy eating plan that has been shown to reduce high blood pressure (hypertension). It may also reduce your risk for type 2 diabetes, heart disease, and stroke.  With the DASH eating plan, you should limit salt (sodium) intake to 2,300 mg a day. If you have hypertension, you may need to reduce your sodium intake to 1,500 mg a day.  When on the DASH eating plan, aim to eat more fresh fruits and vegetables, whole  grains, lean proteins, low-fat dairy, and heart-healthy fats.  Work with your health care provider or diet and nutrition specialist (dietitian) to adjust your eating plan to your individual calorie needs. This information is not intended to replace advice given to you by your health care provider. Make sure you discuss any questions you have with your health care provider. Document Released: 01/11/2011 Document Revised: 01/16/2016 Document Reviewed: 01/16/2016 Elsevier Interactive Patient Education  2017 [employer].

## 2016-09-25 LAB — COMPLETE METABOLIC PANEL WITH GFR
ALBUMIN: 4.6 g/dL (ref 3.6–5.1)
ALT: 46 U/L (ref 9–46)
AST: 30 U/L (ref 10–40)
Alkaline Phosphatase: 87 U/L (ref 40–115)
BILIRUBIN TOTAL: 0.7 mg/dL (ref 0.2–1.2)
BUN: 15 mg/dL (ref 7–25)
CALCIUM: 9.5 mg/dL (ref 8.6–10.3)
CO2: 27 mmol/L (ref 20–32)
CREATININE: 1.43 mg/dL — AB (ref 0.60–1.35)
Chloride: 104 mmol/L (ref 98–110)
GFR, EST AFRICAN AMERICAN: 66 mL/min (ref >=60)
GFR, EST NON AFRICAN AMERICAN: 57 mL/min — AB (ref >=60)
Glucose, Bld: 93 mg/dL (ref 65–99)
Potassium: 4.2 mmol/L (ref 3.5–5.3)
Sodium: 142 mmol/L (ref 135–146)
TOTAL PROTEIN: 6.9 g/dL (ref 6.1–8.1)

## 2016-09-25 LAB — MICROALBUMIN / CREATININE URINE RATIO
CREATININE, URINE: 164 mg/dL (ref 20–370)
MICROALB UR: 0.7 mg/dL
MICROALB/CREAT RATIO: 4 ug/mg{creat} (ref <30)

## 2016-10-12 ENCOUNTER — Other Ambulatory Visit: Payer: Self-pay | Admitting: Family Medicine

## 2016-10-12 ENCOUNTER — Telehealth: Payer: Self-pay | Admitting: Family Medicine

## 2016-10-12 MED ORDER — LISINOPRIL-HYDROCHLOROTHIAZIDE 20-12.5 MG PO TABS: 1.0000 | ORAL_TABLET | Freq: Every day | ORAL | 1 refills | Status: DC

## 2016-10-12 NOTE — Telephone Encounter (Signed)
Pt was notified.  

## 2016-10-12 NOTE — Telephone Encounter (Signed)
Pt called to give BP readings  09-27-16  142/92 09-28-16  134/92 09-29-16  121/87 09-30-16  135/91 10-01-16  134/94 10-02-16  130/95 10-03-16  133/94 10-04-16  134/90 10-05-16  134/90 10-06-16    137/93 10-07-16    136/93 10-08-16    134/90 10-09-16    131/94 10-10-16    134/90   Pt can be reached at [place]

## 2016-10-12 NOTE — Telephone Encounter (Signed)
Let him know that his BPs are not at goal so let's change his medication. I am stopping his lisinopril 40 mg and starting him on a combination lisinopril 20/HCTZ 12.5mg  tablet. Let him know that the HCTZ is a low dose fluid pill and he may urinate more while staring this medication initially.   Keep a record of BPs and report back in 2 weeks. Follow up with me in 1 month.

## 2016-11-07 ENCOUNTER — Other Ambulatory Visit: Payer: Self-pay | Admitting: Family Medicine

## 2016-12-07 ENCOUNTER — Other Ambulatory Visit: Payer: Self-pay | Admitting: Family Medicine

## 2017-02-10 ENCOUNTER — Other Ambulatory Visit: Payer: Self-pay | Admitting: Family Medicine

## 2017-03-25 ENCOUNTER — Encounter: Payer: Self-pay | Admitting: Family Medicine

## 2017-03-25 ENCOUNTER — Ambulatory Visit: Payer: BLUE CROSS/BLUE SHIELD | Admitting: Family Medicine

## 2017-03-25 VITALS — BP 120/80 | HR 99 | Wt 196.6 lb

## 2017-03-25 DIAGNOSIS — R519 Headache, unspecified: Secondary | ICD-10-CM

## 2017-03-25 DIAGNOSIS — R51 Headache: Secondary | ICD-10-CM | POA: Diagnosis not present

## 2017-03-25 DIAGNOSIS — I10 Essential (primary) hypertension: Secondary | ICD-10-CM

## 2017-03-25 DIAGNOSIS — N189 Chronic kidney disease, unspecified: Secondary | ICD-10-CM | POA: Diagnosis not present

## 2017-03-25 NOTE — Progress Notes (Signed)
   Subjective:    Patient ID: Joshua Hickman, male    DOB: 1970/10/02, 47 y.o.   MRN: [id number]  HPI Chief Complaint  Patient presents with  . bp follow-up    bp follow-up   He is here for a 6 month follow up on HTN. Reports good medication compliance and no side effects. He has not been checking his BP at home. States his machine is broken.   States he has intermittent headaches over the past 2 days lasting only a few minutes. States the pain was dull and bilateral posterior. He took excedrin and the pain resolved. No other symptoms. Not exertional headache.  Denies having a headache today.  No history of migraine headaches.   States he is not well hydrated. Feels like he has a low sodium diet.   Denies fever, chills, dizziness, vision changes, tinnitus, chest pain, palpitations, shortness of breath, abdominal pain, N/V/D, urinary symptoms, LE edema.   Reviewed allergies, medications, past medical, surgical, family, and social history.   Review of Systems Pertinent positives and negatives in the history of present illness.     Objective:   Physical Exam BP 120/80   Pulse 99   Wt 196 lb 9.6 oz (89.2 kg)   BMI 31.02 kg/m   Alert and oriented and in no acute distress. Not otherwise examined.       Assessment & Plan:  HYPERTENSION, BENIGN ESSENTIAL  Chronic kidney disease, unspecified CKD stage  Intermittent headache  BP is at goal. Continue on current medication. He will get a new cuff and keep an eye on his BP.  Discussed low sodium diet. He is aware that he has a history of elevated creatinine and we will continue to monitor this.  He is not fasting today.  No headache today. He will let me know if this becomes more of an issue. Counseled on staying hydrated, not skipping meals and getting regular sleep. He denies alcohol or drug use. May be stress related. Negative screening for OSA in the past. sleep study was not done-low risk. Discussed that headache is probably  not related to BP.  Discussed bringing him back in mid to late May for a fasting CPE.

## 2017-03-25 NOTE — Patient Instructions (Signed)
Return for a fasting physical in mid May.   Let me know if your headaches become more of an issue or if your BP is not at goal (<130/80).

## 2017-04-19 ENCOUNTER — Other Ambulatory Visit: Payer: Self-pay | Admitting: Medical

## 2017-05-24 ENCOUNTER — Other Ambulatory Visit: Payer: Self-pay | Admitting: Medical

## 2017-05-27 NOTE — Telephone Encounter (Signed)
Pt is scheduled for mid may for cpe

## 2017-05-30 ENCOUNTER — Ambulatory Visit: Payer: BLUE CROSS/BLUE SHIELD | Admitting: Family Medicine

## 2017-05-30 ENCOUNTER — Encounter: Payer: Self-pay | Admitting: Family Medicine

## 2017-05-30 VITALS — BP 120/80 | HR 58 | Temp 98.4°F | Ht 66.0 in | Wt 188.8 lb

## 2017-05-30 DIAGNOSIS — R2230 Localized swelling, mass and lump, unspecified upper limb: Secondary | ICD-10-CM | POA: Diagnosis not present

## 2017-05-30 NOTE — Progress Notes (Signed)
   Subjective:    Patient ID: Joshua Hickman, male    DOB: 1970/10/20, 47 y.o.   MRN: [id number]  HPI Chief Complaint  Patient presents with  . lump on shoulder    lump on left shoulder. noticed it this morning. no pain   He is here with complaints of a mass on his left anterior shoulder. States he just noticed this today. Denies pain, tenderness. No numbness, tingling or weakness.   States he was pushing a heavy piece of equipment yesterday but did not have pain.   Denies fever, chills, nausea, vomiting.   Reviewed allergies, medications, past medical, surgical, family, and social history.   Review of Systems Pertinent positives and negatives in the history of present illness.     Objective:   Physical Exam  Constitutional: He appears well-developed and well-nourished. No distress.  Musculoskeletal:       Left shoulder: Normal.  Soft, round, non tender, mass that became firm with flexion of deltoid. Consistent with deltoid tear. Normal sensation, motor function of left shoulder and LUE. Skin normal. Unable to elicit pain on exam.    BP 120/80   Pulse (!) 58   Temp 98.4 F (36.9 C) (Oral)   Ht 5\' 6"  (1.676 m)   Wt 188 lb 12.8 oz (85.6 kg)   SpO2 97%   BMI 30.47 kg/m       Assessment & Plan:  Mass of shoulder region  Initial impression was qustionable lipoma. After further exam it appears to be a deltoid muscle tear. Examined by Dr. Redmond School as well. He is asymptomatic so we will do watchful waiting.

## 2017-06-19 ENCOUNTER — Encounter: Payer: BLUE CROSS/BLUE SHIELD | Admitting: Family Medicine

## 2017-06-23 ENCOUNTER — Other Ambulatory Visit: Payer: Self-pay | Admitting: Medical

## 2017-06-23 NOTE — Progress Notes (Signed)
Subjective:    Patient ID: Joshua Hickman, male    DOB: [birth year], 47 y.o.   MRN: [id number]  HPI Chief Complaint  Patient presents with  . fasting cpe    fasting cpe, no other concerns. had recent eye exam,    He is here for a complete physical exam.  Denies any new concerns or complaints today.  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  History of uncontrolled HTN in the past and elevated creatinine.  States in his 49s his blood pressure was uncontrolled.  Other providers: cardiologist in the past and negative echo and stress   Past medical history: HTN- BP at home has been in goal range.  Reports good medication compliance and eating low-sodium when he cooks.  No side effects from medications.  Hyperlipidemia-reports good medication compliance with statin. Denies any side effects.  He is fasting today.  Social history: He is married and has 4 kids, changing jobs. 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  Denies smoking, drinking alcohol, drug use Diet: fairly healthy, does not use salt at home.  Exercise: nothing lately   Immunizations: Tdap up-to-date  Health maintenance:  Colonoscopy: never  Last PSA: never  Last Dental Exam: years ago  Last Eye Exam: DOT physical   Wears seatbelt always, smoke detectors in home and functioning, does not text while driving, feels safe in home environment.  Reviewed allergies, medications, past medical, surgical, family, and social history.     Review of Systems Review of Systems Constitutional: -fever, -chills, -sweats, -unexpected weight change,-fatigue ENT: -runny nose, -ear pain, -sore throat Cardiology:  -chest pain, -palpitations, -edema Respiratory: -cough, -shortness of breath, -wheezing Gastroenterology: -abdominal pain, -nausea, -vomiting, -diarrhea, -constipation  Hematology: -bleeding or bruising problems Musculoskeletal: -arthralgias, -myalgias, -joint swelling, -back pain Ophthalmology: -vision  changes Urology: -dysuria, -difficulty urinating, -hematuria, -urinary frequency, -urgency Neurology: -headache, -weakness, -tingling, -numbness       Objective:   Physical Exam BP 120/70   Pulse (!) 56   Ht 5' 6.25" (1.683 m)   Wt 191 lb 9.6 oz (86.9 kg)   BMI 30.69 kg/m   General Appearance:    Alert, cooperative, no distress, appears stated age  Head:    Normocephalic, without obvious abnormality, atraumatic  Eyes:    PERRL, conjunctiva/corneas clear, EOM's intact, fundi    benign  Ears:    Normal TM's and external ear canals  Nose:   Nares normal, mucosa normal, no drainage or sinus   tenderness  Throat:   Lips, mucosa, and tongue normal; teeth and gums normal  Neck:   Supple, no lymphadenopathy;  thyroid:  no   enlargement/tenderness/nodules; no carotid   bruit or JVD  Back:    Spine nontender, no curvature, ROM normal, no CVA     tenderness  Lungs:     Clear to auscultation bilaterally without wheezes, rales or     ronchi; respirations unlabored  Chest Wall:    No tenderness or deformity   Heart:    Regular rate and rhythm, S1 and S2 normal, no murmur, rub   or gallop  Breast Exam:    No chest wall tenderness, masses or gynecomastia  Abdomen:     Soft, non-tender, nondistended, normoactive bowel sounds,    no masses, no hepatosplenomegaly  Genitalia:   Declines  Rectal:   Declines  Extremities:   No clubbing, cyanosis or edema  Pulses:   2+ and symmetric all extremities  Skin:  Skin color, texture, turgor normal, no rashes or lesions  Lymph nodes:   Cervical, supraclavicular, and axillary nodes normal  Neurologic:   CNII-XII intact, normal strength, sensation and gait; reflexes 2+ and symmetric throughout          Psych:   Normal mood, affect, hygiene and grooming.     Urinalysis dipstick: negative        Assessment & Plan:  Routine general medical examination at a health care facility - Plan: CBC with Differential/Platelet, Comprehensive metabolic panel, POCT  Urinalysis DIP (Proadvantage Device), Lipid panel  HYPERTENSION, BENIGN ESSENTIAL - Plan: CBC with Differential/Platelet, Comprehensive metabolic panel  Chronic kidney disease, unspecified CKD stage - Plan: Comprehensive metabolic panel  HYPERCHOLESTEROLEMIA - Plan: Lipid panel  Screening for prostate cancer - Plan: PSA  He appears to be doing well.  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  Recently passed a DOT physical. Blood pressure well controlled.  Good daily compliance on hypertension and lipid medication.  He is fasting we will check a lipid panel today. He is aware that his creatinine has been elevated for some time.  We will continue to monitor this and adjust medication as needed. He would like to check with his insurance company and go ahead and have a colonoscopy if this would be covered otherwise he will wait till age 79.  He will let me know if he finds out the screening colonoscopy is covered and I will refer him. Discussed risk-benefit of checking PSA.  He has never had a PSA test and he would like to have this done today. He declines GU exam and states he keeps an eye on things.  Recommend he continue doing self testicular exams. Declines STD testing. Counseling done on healthy diet and exercise for overall health specifically for hypertension and hyperlipidemia.   Immunizations per to be up-to-date. Follow-up pending labs or in 6 months.

## 2017-06-24 ENCOUNTER — Encounter: Payer: Self-pay | Admitting: Family Medicine

## 2017-06-24 ENCOUNTER — Other Ambulatory Visit: Payer: Self-pay | Admitting: Family Medicine

## 2017-06-24 ENCOUNTER — Ambulatory Visit: Payer: BLUE CROSS/BLUE SHIELD | Admitting: Family Medicine

## 2017-06-24 VITALS — BP 120/70 | HR 56 | Ht 66.25 in | Wt 191.6 lb

## 2017-06-24 DIAGNOSIS — E7800 Pure hypercholesterolemia, unspecified: Secondary | ICD-10-CM

## 2017-06-24 DIAGNOSIS — N189 Chronic kidney disease, unspecified: Secondary | ICD-10-CM

## 2017-06-24 DIAGNOSIS — I10 Essential (primary) hypertension: Secondary | ICD-10-CM

## 2017-06-24 DIAGNOSIS — Z125 Encounter for screening for malignant neoplasm of prostate: Secondary | ICD-10-CM | POA: Diagnosis not present

## 2017-06-24 DIAGNOSIS — Z0000 Encounter for general adult medical examination without abnormal findings: Secondary | ICD-10-CM

## 2017-06-24 LAB — POCT URINALYSIS DIP (PROADVANTAGE DEVICE)
Bilirubin, UA: NEGATIVE
Blood, UA: NEGATIVE
GLUCOSE UA: NEGATIVE mg/dL
Ketones, POC UA: NEGATIVE mg/dL
Leukocytes, UA: NEGATIVE
Nitrite, UA: NEGATIVE
Protein Ur, POC: NEGATIVE mg/dL
Specific Gravity, Urine: 1.025
UUROB: NEGATIVE
pH, UA: 6 (ref 5.0–8.0)

## 2017-06-24 NOTE — Telephone Encounter (Signed)
Pt is being seen now. Will wait until lipids come back to refill.

## 2017-06-24 NOTE — Patient Instructions (Signed)
Preventative Care for Adults, Male       REGULAR HEALTH EXAMS:  A routine yearly physical is a good way to check in with your primary care provider about your health and preventive screening. It is also an opportunity to share updates about your health and any concerns you have, and receive a thorough all-over exam.   Most health insurance companies pay for at least some preventative services.  Check with your health plan for specific coverages.  WHAT PREVENTATIVE SERVICES DO MEN NEED?  Adult men should have their weight and blood pressure checked regularly.   Men age 35 and older should have their cholesterol levels checked regularly.  Beginning at age 50 and continuing to age 75, men should be screened for colorectal cancer.  Certain people should may need continued testing until age 85.  Other cancer screening may include exams for testicular and prostate cancer.  Updating vaccinations is part of preventative care.  Vaccinations help protect against diseases such as the flu.  Lab tests are generally done as part of preventative care to screen for anemia and blood disorders, to screen for problems with the kidneys and liver, to screen for bladder problems, to check blood sugar, and to check your cholesterol level.  Preventative services generally include counseling about diet, exercise, avoiding tobacco, drugs, excessive alcohol consumption, and sexually transmitted infections.    GENERAL RECOMMENDATIONS FOR GOOD HEALTH:  Healthy diet:  Eat a variety of foods, including fruit, vegetables, animal or vegetable protein, such as meat, fish, chicken, and eggs, or beans, lentils, tofu, and grains, such as rice.  Drink plenty of water daily.  Decrease saturated fat in the diet, avoid lots of red meat, processed foods, sweets, fast foods, and fried foods.  Exercise:  Aerobic exercise helps maintain good heart health. At least 30-40 minutes of moderate-intensity exercise is recommended.  For example, a brisk walk that increases your heart rate and breathing. This should be done on most days of the week.   Find a type of exercise or a variety of exercises that you enjoy so that it becomes a part of your daily life.  Examples are running, walking, swimming, water aerobics, and biking.  For motivation and support, explore group exercise such as aerobic class, spin class, Zumba, Yoga,or  martial arts, etc.    Set exercise goals for yourself, such as a certain weight goal, walk or run in a race such as a 5k walk/run.  Speak to your primary care provider about exercise goals.  Disease prevention:  If you smoke or chew tobacco, find out from your caregiver how to quit. It can literally save your life, no matter how long you have been a tobacco user. If you do not use tobacco, never begin.   Maintain a healthy diet and normal weight. Increased weight leads to problems with blood pressure and diabetes.   The Body Mass Index or BMI is a way of measuring how much of your body is fat. Having a BMI above 27 increases the risk of heart disease, diabetes, hypertension, stroke and other problems related to obesity. Your caregiver can help determine your BMI and based on it develop an exercise and dietary program to help you achieve or maintain this important measurement at a healthful level.  High blood pressure causes heart and blood vessel problems.  Persistent high blood pressure should be treated with medicine if weight loss and exercise do not work.   Fat and cholesterol leaves deposits in your arteries   that can block them. This causes heart disease and vessel disease elsewhere in your body.  If your cholesterol is found to be high, or if you have heart disease or certain other medical conditions, then you may need to have your cholesterol monitored frequently and be treated with medication.   Ask if you should have a stress test if your history suggests this. A stress test is a test done on  a treadmill that looks for heart disease. This test can find disease prior to there being a problem.  Avoid drinking alcohol in excess (more than two drinks per day).  Avoid use of street drugs. Do not share needles with anyone. Ask for professional help if you need assistance or instructions on stopping the use of alcohol, cigarettes, and/or drugs.  Brush your teeth twice a day with fluoride toothpaste, and floss once a day. Good oral hygiene prevents tooth decay and gum disease. The problems can be painful, unattractive, and can cause other health problems. Visit your dentist for a routine oral and dental check up and preventive care every 6-12 months.   Look at your skin regularly.  Use a mirror to look at your back. Notify your caregivers of changes in moles, especially if there are changes in shapes, colors, a size larger than a pencil eraser, an irregular border, or development of new moles.  Safety:  Use seatbelts 100% of the time, whether driving or as a passenger.  Use safety devices such as hearing protection if you work in environments with loud noise or significant background noise.  Use safety glasses when doing any work that could send debris in to the eyes.  Use a helmet if you ride a bike or motorcycle.  Use appropriate safety gear for contact sports.  Talk to your caregiver about gun safety.  Use sunscreen with a SPF (or skin protection factor) of 15 or greater.  Lighter skinned people are at a greater risk of skin cancer. Don't forget to also wear sunglasses in order to protect your eyes from too much damaging sunlight. Damaging sunlight can accelerate cataract formation.   Practice safe sex. Use condoms. Condoms are used for birth control and to help reduce the spread of sexually transmitted infections (or STIs).  Some of the STIs are gonorrhea (the clap), chlamydia, syphilis, trichomonas, herpes, HPV (human papilloma virus) and HIV (human immunodeficiency virus) which causes AIDS.  The herpes, HIV and HPV are viral illnesses that have no cure. These can result in disability, cancer and death.   Keep carbon monoxide and smoke detectors in your home functioning at all times. Change the batteries every 6 months or use a model that plugs into the wall.   Vaccinations:  Stay up to date with your tetanus shots and other required immunizations. You should have a booster for tetanus every 10 years. Be sure to get your flu shot every year, since 5%-20% of the [place] population comes down with the flu. The flu vaccine changes each year, so being vaccinated once is not enough. Get your shot in the fall, before the flu season peaks.   Other vaccines to consider:  Pneumococcal vaccine to protect against certain types of pneumonia.  This is normally recommended for adults age 64 or older.  However, adults younger than 49 years old with certain underlying conditions such as diabetes, heart or lung disease should also receive the vaccine.  Shingles vaccine to protect against Varicella Zoster if you are older than age 76, or younger  than 49 years old with certain underlying illness.  Hepatitis A vaccine to protect against a form of infection of the liver by a virus acquired from food.  Hepatitis B vaccine to protect against a form of infection of the liver by a virus acquired from blood or body fluids, particularly if you work in health care.  If you plan to travel internationally, check with your local health department for specific vaccination recommendations.  Cancer Screening:  Most routine colon cancer screening begins at the age of 26. On a yearly basis, doctors may provide special easy to use take-home tests to check for hidden blood in the stool. Sigmoidoscopy or colonoscopy can detect the earliest forms of colon cancer and is life saving. These tests use a small camera at the end of a tube to directly examine the colon. Speak to your caregiver about this at age 79, when routine  screening begins (and is repeated every 5 years unless early forms of pre-cancerous polyps or small growths are found).   At the age of 50 men usually start screening for prostate cancer every year. Screening may begin at a younger age for those with higher risk. Those at higher risk include African-Americans or having a family history of prostate cancer. There are two types of tests for prostate cancer:   Prostate-specific antigen (PSA) testing. Recent studies raise questions about prostate cancer using PSA and you should discuss this with your caregiver.   Digital rectal exam (in which your doctor's lubricated and gloved finger feels for enlargement of the prostate through the anus).   Screening for testicular cancer.  Do a monthly exam of your testicles. Gently roll each testicle between your thumb and fingers, feeling for any abnormal lumps. The best time to do this is after a hot shower or bath when the tissues are looser. Notify your caregivers of any lumps, tenderness or changes in size or shape immediately.

## 2017-06-25 ENCOUNTER — Other Ambulatory Visit: Payer: Self-pay | Admitting: Family Medicine

## 2017-06-25 DIAGNOSIS — E870 Hyperosmolality and hypernatremia: Secondary | ICD-10-CM

## 2017-06-25 DIAGNOSIS — R748 Abnormal levels of other serum enzymes: Secondary | ICD-10-CM

## 2017-06-25 LAB — CBC WITH DIFFERENTIAL/PLATELET
BASOS ABS: 0 10*3/uL (ref 0.0–0.2)
Basos: 1 %
EOS (ABSOLUTE): 0.1 10*3/uL (ref 0.0–0.4)
EOS: 2 %
HEMATOCRIT: 42.9 % (ref 37.5–51.0)
Hemoglobin: 14.5 g/dL (ref 13.0–17.7)
IMMATURE GRANS (ABS): 0 10*3/uL (ref 0.0–0.1)
IMMATURE GRANULOCYTES: 0 %
LYMPHS ABS: 2.1 10*3/uL (ref 0.7–3.1)
LYMPHS: 39 %
MCH: 30 pg (ref 26.6–33.0)
MCHC: 33.8 g/dL (ref 31.5–35.7)
MCV: 89 fL (ref 79–97)
MONOCYTES: 7 %
Monocytes Absolute: 0.4 10*3/uL (ref 0.1–0.9)
NEUTROS PCT: 51 %
Neutrophils Absolute: 2.7 10*3/uL (ref 1.4–7.0)
PLATELETS: 267 10*3/uL (ref 150–450)
RBC: 4.84 x10E6/uL (ref 4.14–5.80)
RDW: 14.3 % (ref 12.3–15.4)
WBC: 5.3 10*3/uL (ref 3.4–10.8)

## 2017-06-25 LAB — COMPREHENSIVE METABOLIC PANEL
ALT: 77 IU/L — AB (ref 0–44)
AST: 42 IU/L — AB (ref 0–40)
Albumin/Globulin Ratio: 2 (ref 1.2–2.2)
Albumin: 4.7 g/dL (ref 3.5–5.5)
Alkaline Phosphatase: 117 IU/L (ref 39–117)
BUN/Creatinine Ratio: 8 — ABNORMAL LOW (ref 9–20)
BUN: 12 mg/dL (ref 6–24)
Bilirubin Total: 0.6 mg/dL (ref 0.0–1.2)
CALCIUM: 9.1 mg/dL (ref 8.7–10.2)
CO2: 28 mmol/L (ref 20–29)
CREATININE: 1.44 mg/dL — AB (ref 0.76–1.27)
Chloride: 103 mmol/L (ref 96–106)
GFR calc Af Amer: 65 mL/min/{1.73_m2} (ref >59)
GFR, EST NON AFRICAN AMERICAN: 57 mL/min/{1.73_m2} — AB (ref >59)
GLOBULIN, TOTAL: 2.4 g/dL (ref 1.5–4.5)
Glucose: 92 mg/dL (ref 65–99)
Potassium: 3.7 mmol/L (ref 3.5–5.2)
Sodium: 147 mmol/L — ABNORMAL HIGH (ref 134–144)
TOTAL PROTEIN: 7.1 g/dL (ref 6.0–8.5)

## 2017-06-25 LAB — LIPID PANEL
CHOLESTEROL TOTAL: 136 mg/dL (ref 100–199)
Chol/HDL Ratio: 3.2 ratio (ref 0.0–5.0)
HDL: 43 mg/dL (ref >39)
LDL CALC: 82 mg/dL (ref 0–99)
TRIGLYCERIDES: 55 mg/dL (ref 0–149)
VLDL CHOLESTEROL CAL: 11 mg/dL (ref 5–40)

## 2017-06-25 LAB — PSA: Prostate Specific Ag, Serum: 0.8 ng/mL (ref 0.0–4.0)

## 2017-06-25 NOTE — Progress Notes (Signed)
   Subjective:    Patient ID: Joshua Hickman, male    DOB: [birth year], 47 y.o.   MRN: [id number]  HPI    Review of Systems     Objective:   Physical Exam        Assessment & Plan:

## 2017-06-26 LAB — HEPATITIS PANEL, ACUTE
HEP A IGM: NEGATIVE
Hep B C IgM: NEGATIVE
Hep C Virus Ab: 0.1 s/co ratio (ref 0.0–0.9)
Hepatitis B Surface Ag: NEGATIVE

## 2017-06-26 LAB — SPECIMEN STATUS REPORT

## 2017-06-29 ENCOUNTER — Other Ambulatory Visit: Payer: Self-pay | Admitting: Family Medicine

## 2017-07-09 ENCOUNTER — Other Ambulatory Visit: Payer: BLUE CROSS/BLUE SHIELD

## 2017-07-09 DIAGNOSIS — E870 Hyperosmolality and hypernatremia: Secondary | ICD-10-CM

## 2017-07-09 DIAGNOSIS — R748 Abnormal levels of other serum enzymes: Secondary | ICD-10-CM

## 2017-07-10 LAB — COMPREHENSIVE METABOLIC PANEL
A/G RATIO: 2 (ref 1.2–2.2)
ALT: 59 IU/L — AB (ref 0–44)
AST: 40 IU/L (ref 0–40)
Albumin: 4.6 g/dL (ref 3.5–5.5)
Alkaline Phosphatase: 104 IU/L (ref 39–117)
BUN/Creatinine Ratio: 11 (ref 9–20)
BUN: 17 mg/dL (ref 6–24)
Bilirubin Total: 0.6 mg/dL (ref 0.0–1.2)
CALCIUM: 9.3 mg/dL (ref 8.7–10.2)
CO2: 23 mmol/L (ref 20–29)
Chloride: 102 mmol/L (ref 96–106)
Creatinine, Ser: 1.61 mg/dL — ABNORMAL HIGH (ref 0.76–1.27)
GFR, EST AFRICAN AMERICAN: 57 mL/min/{1.73_m2} — AB (ref >59)
GFR, EST NON AFRICAN AMERICAN: 49 mL/min/{1.73_m2} — AB (ref >59)
GLOBULIN, TOTAL: 2.3 g/dL (ref 1.5–4.5)
Glucose: 88 mg/dL (ref 65–99)
POTASSIUM: 4.5 mmol/L (ref 3.5–5.2)
SODIUM: 143 mmol/L (ref 134–144)
TOTAL PROTEIN: 6.9 g/dL (ref 6.0–8.5)

## 2017-08-30 ENCOUNTER — Other Ambulatory Visit: Payer: Self-pay | Admitting: Family Medicine

## 2017-09-09 ENCOUNTER — Other Ambulatory Visit: Payer: Self-pay | Admitting: Family Medicine

## 2017-09-16 ENCOUNTER — Ambulatory Visit: Payer: BLUE CROSS/BLUE SHIELD | Admitting: Family Medicine

## 2017-09-23 ENCOUNTER — Encounter: Payer: Self-pay | Admitting: Family Medicine

## 2017-09-23 ENCOUNTER — Ambulatory Visit: Payer: BLUE CROSS/BLUE SHIELD | Admitting: Family Medicine

## 2017-09-23 VITALS — BP 110/70 | HR 83 | Wt 196.6 lb

## 2017-09-23 DIAGNOSIS — E7800 Pure hypercholesterolemia, unspecified: Secondary | ICD-10-CM

## 2017-09-23 DIAGNOSIS — R740 Nonspecific elevation of levels of transaminase and lactic acid dehydrogenase [LDH]: Secondary | ICD-10-CM | POA: Diagnosis not present

## 2017-09-23 DIAGNOSIS — N189 Chronic kidney disease, unspecified: Secondary | ICD-10-CM

## 2017-09-23 DIAGNOSIS — I10 Essential (primary) hypertension: Secondary | ICD-10-CM | POA: Diagnosis not present

## 2017-09-23 DIAGNOSIS — R7401 Elevation of levels of liver transaminase levels: Secondary | ICD-10-CM

## 2017-09-23 NOTE — Progress Notes (Signed)
   Subjective:    Patient ID: Joshua Hickman, male    DOB: 1970/03/18, 47 y.o.   MRN: [id number]  HPI Chief Complaint  Patient presents with  . follow-up    follow-up on kidney function   He is here to follow up on elevated serum creatinine and elevated LFTs.  Reports blood pressure has been normal and no side effects from medication.  Reports good daily compliance on blood pressure medication as well as statin.  No new concerns today.  Denies fever, chills, dizziness, chest pain, palpitations, shortness of breath, abdominal pain, N/V/D, urinary symptoms, LE edema.   Reviewed allergies, medications, past medical, surgical, family, and social history.   Review of Systems Pertinent positives and negatives in the history of present illness.     Objective:   Physical Exam BP 110/70   Pulse 83   Wt 196 lb 9.6 oz (89.2 kg)   BMI 31.49 kg/m   Alert and oriented and in no distress.  Not otherwise examined.      Assessment & Plan:  HYPERTENSION, BENIGN ESSENTIAL - Plan: Comprehensive metabolic panel  Chronic kidney disease, unspecified CKD stage - Plan: Comprehensive metabolic panel  HYPERCHOLESTEROLEMIA  Elevated ALT measurement - Plan: Comprehensive metabolic panel  Blood pressure is in normal range.  He will continue on medication.  Counseling done on healthy diet and exercise. Discussed good blood pressure control for CKD.  Apparently he has had this since at least 2014 and stable. He is on a statin daily and no side effects.  He will continue on this. Elevated liver function testing with negative acute hepatitis panel.  Recheck this today. Follow-up pending labs.

## 2017-09-24 ENCOUNTER — Other Ambulatory Visit: Payer: Self-pay | Admitting: Family Medicine

## 2017-09-24 DIAGNOSIS — R748 Abnormal levels of other serum enzymes: Secondary | ICD-10-CM

## 2017-09-24 LAB — COMPREHENSIVE METABOLIC PANEL
ALT: 86 IU/L — AB (ref 0–44)
AST: 42 IU/L — AB (ref 0–40)
Albumin/Globulin Ratio: 2 (ref 1.2–2.2)
Albumin: 4.6 g/dL (ref 3.5–5.5)
Alkaline Phosphatase: 108 IU/L (ref 39–117)
BUN/Creatinine Ratio: 10 (ref 9–20)
BUN: 15 mg/dL (ref 6–24)
Bilirubin Total: 0.4 mg/dL (ref 0.0–1.2)
CALCIUM: 9.3 mg/dL (ref 8.7–10.2)
CO2: 27 mmol/L (ref 20–29)
CREATININE: 1.44 mg/dL — AB (ref 0.76–1.27)
Chloride: 101 mmol/L (ref 96–106)
GFR calc non Af Amer: 57 mL/min/{1.73_m2} — ABNORMAL LOW (ref >59)
GFR, EST AFRICAN AMERICAN: 65 mL/min/{1.73_m2} (ref >59)
Globulin, Total: 2.3 g/dL (ref 1.5–4.5)
Glucose: 96 mg/dL (ref 65–99)
POTASSIUM: 3.6 mmol/L (ref 3.5–5.2)
Sodium: 143 mmol/L (ref 134–144)
TOTAL PROTEIN: 6.9 g/dL (ref 6.0–8.5)

## 2017-10-01 ENCOUNTER — Ambulatory Visit
Admission: RE | Admit: 2017-10-01 | Discharge: 2017-10-01 | Disposition: A | Discharge status: Home / Self Care | Payer: BLUE CROSS/BLUE SHIELD | Source: Ambulatory Visit | Attending: Family Medicine | Admitting: Family Medicine

## 2017-10-01 ENCOUNTER — Other Ambulatory Visit: Payer: Self-pay | Admitting: Medical

## 2017-10-01 DIAGNOSIS — R748 Abnormal levels of other serum enzymes: Secondary | ICD-10-CM

## 2017-10-19 ENCOUNTER — Ambulatory Visit (HOSPITAL_COMMUNITY)
Admission: EM | Admit: 2017-10-19 | Discharge: 2017-10-19 | Disposition: A | Discharge status: Home / Self Care | Payer: BLUE CROSS/BLUE SHIELD | Attending: Internal Medicine | Admitting: Internal Medicine

## 2017-10-19 ENCOUNTER — Encounter (HOSPITAL_COMMUNITY): Payer: Self-pay | Admitting: *Deleted

## 2017-10-19 DIAGNOSIS — S76911A Strain of unspecified muscles, fascia and tendons at thigh level, right thigh, initial encounter: Secondary | ICD-10-CM

## 2017-10-19 MED ORDER — TIZANIDINE HCL 2 MG PO CAPS: 2.0000 mg | ORAL_CAPSULE | Freq: Every evening | ORAL | 0 refills | Status: DC | PRN

## 2017-10-19 NOTE — ED Provider Notes (Signed)
[facility]   [id number] 10/19/17 Arrival Time: 1010  CC: Right thigh pain  SUBJECTIVE: History from: patient. Joshua Hickman is a 47 y.o. male hx significant for HTN, and HDL complains of right leg pain that began yesterday.  Symptoms began after standing from a seated position.  Localizes the pain to the medial thigh.  Describes the pain as intermittent and as a "spasm."  Has NOT tried OTC medications.  Symptoms are made worse with crossing right leg over left leg.  Reports hx of cramps in the past, but not this severe.  Denies fever, chills, nausea, vomiting, erythema, ecchymosis, effusion, weakness, numbness and tingling.      Denies hx of long travel in car or by plane, malignancy, DVT/PE, hormone or steroid use, tobacco use.    ROS: As per HPI.  Past Medical History:  Diagnosis Date  . Hemorrhoid   . History of nuclear stress test 03/29/2009   normal exam without evidence of exercise-induced sichemia  . Hyperlipidemia   . Hypertension    Past Surgical History:  Procedure Laterality Date  . TRANSTHORACIC ECHOCARDIOGRAM  05/25/2012   EF 55-60%, mild conc hypertrophy, normal systolic function; mild MR   No Known Allergies No current facility-administered medications on file prior to encounter.    Current Outpatient Medications on File Prior to Encounter  Medication Sig Dispense Refill  . atorvastatin (LIPITOR) 40 MG tablet TAKE 1 TABLET BY MOUTH EVERY DAY 30 tablet 5  . ibuprofen (ADVIL,MOTRIN) 800 MG tablet Take 800 mg by mouth every 8 (eight) hours as needed.    Marland Kitchen lisinopril-hydrochlorothiazide (PRINZIDE,ZESTORETIC) 20-12.5 MG tablet TAKE 1 TABLET BY MOUTH EVERY DAY 30 tablet 2   Social History   Socioeconomic History  . Marital status: Married    Spouse name: Not on file  . Number of children: 2  . Years of education: 30  . Highest education level: Not on file  Occupational History  . 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 Financial resource strain: Not on file  . Food insecurity:    Worry: Not on file    Inability: Not on file  . Transportation needs:    Medical: Not on file    Non-medical: Not on file  Tobacco Use  . Smoking status: Never Smoker  . Smokeless tobacco: Never Used  Substance and Sexual Activity  . Alcohol use: No  . Drug use: No  . Sexual activity: Not on file  Lifestyle  . Physical activity:    Days per week: Not on file    Minutes per session: Not on file  . Stress: Not on file  Relationships  . Social connections:    Talks on phone: Not on file    Gets together: Not on file    Attends religious service: Not on file    Active member of club or organization: Not on file    Attends meetings of clubs or organizations: Not on file    Relationship status: Not on file  . Intimate partner violence:    Fear of current or ex partner: Not on file    Emotionally abused: Not on file    Physically abused: Not on file    Forced sexual activity: Not on file  Other Topics Concern  . Not on file  Social History Narrative  . Not on file   Family History  Problem Relation Age of Onset  . Heart attack Father 42  61  . Hypertension Brother        also abdominal cancer  . Cancer Brother     OBJECTIVE:  Vitals:   10/19/17 1033  BP: 115/66  Pulse: (!) 54  Resp: 16  Temp: 98.3 F (36.8 C)  TempSrc: Oral  SpO2: 100%    General appearance: AOx3; in no acute distress.  Head: NCAT Lungs: CTA bilaterally Heart: RRR.  Clear S1 and S2 without murmur, gallops, or rubs. RT Posterior tibialis 2+ Musculoskeletal: Right thigh Inspection: Skin warm, dry, clear and intact without obvious erythema, effusion, or ecchymosis.  Palpation: Nontender to palpation; unable to reproduce symptoms on examination ROM: FROM active and passive; reports minimal discomfort to medial thigh with hip ER and knee IR Strength:  5/5 hip flexion, 5/5 knee abduction, 5/5 knee adduction, 5/5 knee  flexion, 5/5 knee extension Skin: warm and dry Neurologic: Ambulates without difficulty; Sensation intact about the lower extremities Psychological: alert and cooperative; normal mood and affect  ASSESSMENT & PLAN:  1. Muscle strain of right thigh, initial encounter     Meds ordered this encounter  Medications  . tizanidine (ZANAFLEX) 2 MG capsule    Sig: Take 1 capsule (2 mg total) by mouth at bedtime as needed for muscle spasms (Do not exceed 3 doses in a 24 hour period).    Dispense:  10 capsule    Refill:  0    Order Specific Question:   Supervising Provider    Answer:   Wynona Luna [[id number]]    Continue conservative management of rest, ice, heat, and gentle stretches Takes tizanidine at nighttime for symptomatic relief. Avoid driving or operating heavy machinery while using medication. Follow up with PCP if symptoms persist Return or go to the ER if you have any new or worsening symptoms (fever, chills, chest pain, abdominal pain, changes in bowel or bladder habits, pain radiating into lower legs, etc...)   Discussed with patient that current medications may be contributing to his symptoms and he should follow up with PCP for further evaluation and management.  Also encouraged patient to increase his fluid intake.    Reviewed expectations re: course of current medical issues. Questions answered. Outlined signs and symptoms indicating need for more acute intervention. Patient verbalized understanding. After Visit Summary given.    Lestine Box, PA-C 10/19/17 1136

## 2017-10-19 NOTE — Discharge Instructions (Addendum)
Continue conservative management of rest, ice, heat, and gentle stretches Take tizanidine at nighttime for symptomatic relief. Avoid driving or operating heavy machinery while using medication. Follow up with PCP if symptoms persist Return or go to the ER if you have any new or worsening symptoms (fever, chills, chest pain, abdominal pain, changes in bowel or bladder habits, pain radiating into lower legs, etc...)

## 2017-10-19 NOTE — ED Triage Notes (Signed)
Pt c/o severe cramp to upper right leg yesterday. Pt states pain was so bad that he felt like he "was going to pass out." Pt states that leg is feeling "weird" again today and that he feels like it is going to happen again.

## 2017-12-25 ENCOUNTER — Encounter: Payer: BLUE CROSS/BLUE SHIELD | Admitting: Family Medicine

## 2017-12-31 ENCOUNTER — Other Ambulatory Visit: Payer: Self-pay | Admitting: Family Medicine

## 2018-01-01 ENCOUNTER — Other Ambulatory Visit: Payer: Self-pay | Admitting: Family Medicine

## 2018-01-06 ENCOUNTER — Encounter: Payer: Self-pay | Admitting: Family Medicine

## 2018-01-06 ENCOUNTER — Ambulatory Visit: Payer: BLUE CROSS/BLUE SHIELD | Admitting: Family Medicine

## 2018-01-06 VITALS — BP 112/80 | HR 54 | Temp 98.4°F | Resp 16 | Ht 66.0 in | Wt 189.0 lb

## 2018-01-06 DIAGNOSIS — I10 Essential (primary) hypertension: Secondary | ICD-10-CM

## 2018-01-06 DIAGNOSIS — N189 Chronic kidney disease, unspecified: Secondary | ICD-10-CM | POA: Diagnosis not present

## 2018-01-06 DIAGNOSIS — K824 Cholesterolosis of gallbladder: Secondary | ICD-10-CM | POA: Insufficient documentation

## 2018-01-06 DIAGNOSIS — R748 Abnormal levels of other serum enzymes: Secondary | ICD-10-CM | POA: Diagnosis not present

## 2018-01-06 HISTORY — DX: Abnormal levels of other serum enzymes: R74.8

## 2018-01-06 LAB — COMPREHENSIVE METABOLIC PANEL
ALT: 49 IU/L — AB (ref 0–44)
AST: 25 IU/L (ref 0–40)
Albumin/Globulin Ratio: 2.1 (ref 1.2–2.2)
Albumin: 4.5 g/dL (ref 3.5–5.5)
Alkaline Phosphatase: 95 IU/L (ref 39–117)
BUN/Creatinine Ratio: 11 (ref 9–20)
BUN: 17 mg/dL (ref 6–24)
Bilirubin Total: 0.4 mg/dL (ref 0.0–1.2)
CALCIUM: 9.5 mg/dL (ref 8.7–10.2)
CO2: 27 mmol/L (ref 20–29)
Chloride: 100 mmol/L (ref 96–106)
Creatinine, Ser: 1.58 mg/dL — ABNORMAL HIGH (ref 0.76–1.27)
GFR calc Af Amer: 59 mL/min/{1.73_m2} — ABNORMAL LOW (ref >59)
GFR, EST NON AFRICAN AMERICAN: 51 mL/min/{1.73_m2} — AB (ref >59)
GLUCOSE: 97 mg/dL (ref 65–99)
Globulin, Total: 2.1 g/dL (ref 1.5–4.5)
Potassium: 4.2 mmol/L (ref 3.5–5.2)
Sodium: 143 mmol/L (ref 134–144)
Total Protein: 6.6 g/dL (ref 6.0–8.5)

## 2018-01-06 NOTE — Progress Notes (Signed)
   Subjective:    Patient ID: Joshua Hickman, male    DOB: October 22, 1970, 47 y.o.   MRN: [id number]  HPI Chief Complaint  Patient presents with  . follow up    follow up on liver enzymes    He is here for a 4 month follow up on elevated liver enzymes and serum creatinine.  Abdominal RUQ [place] was done in August 2019 to look at his liver.   States his diet is pretty poor but no particularly high in fat. Very active job but no exercise otherwise.   Denies alcohol use. Does not smoke.   Does not take NSAIDs regularly.    Negative acute hepatitis panel.   Denies fever, chills, dizziness, chest pain, palpitations, shortness of breath, abdominal pain, N/V/D, urinary symptoms.   CLINICAL DATA:  Elevated liver enzymes. History of hypercholesterolemia and chronic kidney disease.  EXAM: ULTRASOUND ABDOMEN LIMITED RIGHT UPPER QUADRANT  COMPARISON:  None.  FINDINGS: Gallbladder:  The gallbladder is adequately distended. There is an echogenic 5 mm diameter focus adherent to the mucosa of the gallbladder near the neck. This likely reflects a polyp. No echogenic mobile shadowing stones are observed. There is no gallbladder wall thickening, pericholecystic fluid, or positive sonographic Murphy's sign.  Common bile duct:  Diameter: 2.9 mm  Liver:  The hepatic echotexture is mildly increased diffusely. There is no focal mass nor ductal dilation. Portal vein is patent on color Doppler imaging with normal direction of blood flow towards the liver.  IMPRESSION: Probable 5 mm gallbladder polyp. No mobile stones are demonstrated. No sonographic evidence of acute cholecystitis.  Electronically Signed   By: David  Martinique M.D.   On: 10/01/2017 13:57    Review of Systems Pertinent positives and negatives in the history of present illness.     Objective:   Physical Exam BP 112/80   Pulse (!) 54   Temp 98.4 F (36.9 C) (Oral)   Resp 16   Ht 5\' 6"  (1.676 m)   Wt 189 lb  (85.7 kg)   SpO2 97%   BMI 30.51 kg/m  Alert and oriented in no acute distress.  Normal conjunctiva, sclera anicteric.  Respirations unlabored.  Skin is warm and dry.  Speech, mood and thought processes normal.     Assessment & Plan:  Elevated liver enzymes - Plan: Comprehensive metabolic panel  Chronic kidney disease, unspecified CKD stage - Plan: Comprehensive metabolic panel  Polyp of gallbladder  HYPERTENSION, BENIGN ESSENTIAL  He appears to be doing well.  We reviewed lab and ultrasound results.  Discussed that he had an incidentaloma with a probably small polyp in his gallbladder.  He is asymptomatic therefore guidelines suggest a repeat in 1 year from previous ultrasound. Blood pressure is within goal range.  He will continue on medication. Counseled on healthy diet and lifestyle for mild fatty liver finding.  Denies alcohol use or regular NSAID use Discussed that if his liver enzymes continue to be elevated that we will refer him to GI for further evaluation Kidney function has been stable for years apparently. Continue monitoring this.  Follow up pending labs.

## 2018-04-22 ENCOUNTER — Other Ambulatory Visit: Payer: Self-pay | Admitting: Family Medicine

## 2018-06-26 ENCOUNTER — Encounter: Payer: BLUE CROSS/BLUE SHIELD | Admitting: Family Medicine

## 2018-08-11 ENCOUNTER — Telehealth: Payer: Self-pay | Admitting: Internal Medicine

## 2018-08-11 NOTE — Telephone Encounter (Signed)
Pt has [facility] insurance so he can't be seen for cpe. If he gets new insurance he will come back to [place]

## 2018-08-13 ENCOUNTER — Other Ambulatory Visit: Payer: Self-pay | Admitting: Family Medicine

## 2018-08-13 NOTE — Telephone Encounter (Signed)
Pt has [employer] insurance and has had to switch to another provider

## 2018-08-20 ENCOUNTER — Telehealth: Payer: Self-pay | Admitting: Family Medicine

## 2018-08-20 NOTE — Telephone Encounter (Signed)
Will fax back refill request

## 2018-08-20 NOTE — Telephone Encounter (Signed)
Pt has [employer] insurance and was finding another doctor

## 2018-08-20 NOTE — Telephone Encounter (Signed)
Fax refill from [facility]  Atorvastatin 40 [phone]

## 2018-09-30 ENCOUNTER — Telehealth: Payer: Self-pay | Admitting: Family Medicine

## 2018-09-30 DIAGNOSIS — E7800 Pure hypercholesterolemia, unspecified: Secondary | ICD-10-CM

## 2018-09-30 DIAGNOSIS — I10 Essential (primary) hypertension: Secondary | ICD-10-CM

## 2018-09-30 DIAGNOSIS — N189 Chronic kidney disease, unspecified: Secondary | ICD-10-CM

## 2018-09-30 DIAGNOSIS — R748 Abnormal levels of other serum enzymes: Secondary | ICD-10-CM

## 2018-09-30 NOTE — Telephone Encounter (Signed)
Pt states he can't get in until November with new [facility] provider and that his insurance is the only reason he is switching providers temporarily until can change back here.  He needs refills on Atorvastatin & Lisinopril to [facility]

## 2018-09-30 NOTE — Telephone Encounter (Signed)
Pt is coming in for labs on sept 2nd and then after labs are done then we can refill meds

## 2018-09-30 NOTE — Telephone Encounter (Signed)
I am happy to help him with this but I would need to have him come in for fasting labs. Needs CBC, CMP, Lipids. Then I will be able to refill his medications until November unless lab findings would require further work up.

## 2018-10-08 ENCOUNTER — Other Ambulatory Visit: Payer: BLUE CROSS/BLUE SHIELD

## 2018-10-08 ENCOUNTER — Encounter: Payer: BLUE CROSS/BLUE SHIELD | Admitting: Medical

## 2018-10-08 ENCOUNTER — Other Ambulatory Visit: Payer: Self-pay

## 2018-10-08 DIAGNOSIS — E7800 Pure hypercholesterolemia, unspecified: Secondary | ICD-10-CM

## 2018-10-08 DIAGNOSIS — N189 Chronic kidney disease, unspecified: Secondary | ICD-10-CM

## 2018-10-08 DIAGNOSIS — I10 Essential (primary) hypertension: Secondary | ICD-10-CM

## 2018-10-08 DIAGNOSIS — R748 Abnormal levels of other serum enzymes: Secondary | ICD-10-CM

## 2018-10-09 ENCOUNTER — Other Ambulatory Visit: Payer: Self-pay | Admitting: Internal Medicine

## 2018-10-09 LAB — COMPREHENSIVE METABOLIC PANEL
ALT: 45 IU/L — ABNORMAL HIGH (ref 0–44)
AST: 36 IU/L (ref 0–40)
Albumin/Globulin Ratio: 3 — ABNORMAL HIGH (ref 1.2–2.2)
Albumin: 4.8 g/dL (ref 4.0–5.0)
Alkaline Phosphatase: 92 IU/L (ref 39–117)
BUN/Creatinine Ratio: 13 (ref 9–20)
BUN: 18 mg/dL (ref 6–24)
Bilirubin Total: 0.5 mg/dL (ref 0.0–1.2)
CO2: 24 mmol/L (ref 20–29)
Calcium: 9.5 mg/dL (ref 8.7–10.2)
Chloride: 106 mmol/L (ref 96–106)
Creatinine, Ser: 1.34 mg/dL — ABNORMAL HIGH (ref 0.76–1.27)
GFR calc Af Amer: 71 mL/min/{1.73_m2} (ref >59)
GFR calc non Af Amer: 61 mL/min/{1.73_m2} (ref >59)
Globulin, Total: 1.6 g/dL (ref 1.5–4.5)
Glucose: 93 mg/dL (ref 65–99)
Potassium: 4.1 mmol/L (ref 3.5–5.2)
Sodium: 144 mmol/L (ref 134–144)
Total Protein: 6.4 g/dL (ref 6.0–8.5)

## 2018-10-09 MED ORDER — ATORVASTATIN CALCIUM 40 MG PO TABS: 40.0000 mg | ORAL_TABLET | Freq: Every day | ORAL | 1 refills | Status: AC

## 2018-10-09 MED ORDER — LISINOPRIL-HYDROCHLOROTHIAZIDE 20-12.5 MG PO TABS: 1.0000 | ORAL_TABLET | Freq: Every day | ORAL | 1 refills | Status: DC

## 2018-10-22 DIAGNOSIS — Z0289 Encounter for other administrative examinations: Secondary | ICD-10-CM

## 2019-08-06 NOTE — Progress Notes (Signed)
Subjective:    Patient ID: Joshua Hickman, male    DOB: 1971/01/14, 49 y.o.   MRN: [id number]  HPI Chief Complaint  Patient presents with  . fasting cpe    fasitng cpe, no concerns   He is here for a complete physical exam. Previous medical care: he had [facility] last year and had to see providers in that network. States he came back as soon as he could switch insurance.  Last CPE: 2019  Other providers: None   HTN- taking lisinopril-HCTZ daily.   HL- reports taking statin daily without any issues   CKD- we have been monitoring this. Denies taking NSAIDs often.   Gallbladder polyp -needs repeat US in 01/2020   Social history: Lives with wife, works as a Engineer, drilling. Denies smoking, drinking alcohol, drug use Diet: healthy at times.  Exercise: nothing regular   Immunizations: last Covid vaccine June 24th.   Health maintenance:  Colonoscopy: never  Last PSA: 2019  Last Dental Exam: years ago  Last Eye Exam: 6 months ago   Wears seatbelt always, smoke detectors in home and functioning, does not text while driving, feels safe in home environment.  Reviewed allergies, medications, past medical, surgical, family, and social history.  Review of Systems Review of Systems Constitutional: -fever, -chills, -sweats, -unexpected weight change,-fatigue ENT: -runny nose, -ear pain, -sore throat Cardiology:  -chest pain, -palpitations, -edema Respiratory: -cough, -shortness of breath, -wheezing Gastroenterology: -abdominal pain, -nausea, -vomiting, -diarrhea, -constipation  Hematology: -bleeding or bruising problems Musculoskeletal: -arthralgias, -myalgias, -joint swelling, -back pain Ophthalmology: -vision changes Urology: -dysuria, -difficulty urinating, -hematuria, -urinary frequency, -urgency Neurology: -headache, -weakness, -tingling, -numbness       Objective:   Physical Exam BP 122/78   Pulse (!) 50   Ht 5' 6.5" (1.689 m)   Wt 191 lb  3.2 oz (86.7 kg)   SpO2 99%   BMI 30.40 kg/m   General Appearance:    Alert, cooperative, no distress, appears stated age  Head:    Normocephalic, without obvious abnormality, atraumatic  Eyes:    PERRL, conjunctiva/corneas clear, EOM's intact  Ears:    Normal TM's and external ear canals  Nose:   Mask on  Throat:   Mask on   Neck:   Supple, no lymphadenopathy;  thyroid:  no   enlargement/tenderness/nodules; no JVD  Back:    Spine nontender, no curvature, ROM normal, no CVA     tenderness  Lungs:     Clear to auscultation bilaterally without wheezes, rales or     ronchi; respirations unlabored  Chest Wall:    No tenderness or deformity   Heart:    Regular rate and rhythm, S1 and S2 normal, no murmur, rub   or gallop  Breast Exam:    No chest wall tenderness, masses or gynecomastia  Abdomen:     Soft, non-tender, nondistended, normoactive bowel sounds,    no masses, no hepatosplenomegaly  Genitalia:    Declines   Rectal:    Declines   Extremities:   No clubbing, cyanosis or edema  Pulses:   2+ and symmetric all extremities  Skin:   Skin color, texture, turgor normal, no rashes or lesions  Lymph nodes:   Cervical, supraclavicular, and axillary nodes normal  Neurologic:   CNII-XII intact, normal strength, sensation and gait; reflexes 2+ and symmetric throughout          Psych:   Normal mood, affect, hygiene and grooming.  Assessment & Plan:  Routine general medical examination at a health care facility - Plan: CBC with Differential/Platelet, Comprehensive metabolic panel, TSH, T4, free -He is here today for a fasting CPE.  Preventive health care reviewed and updated.  Counseled on healthy lifestyle including diet exercise.  Immunizations reviewed.  Discussed safety and health promotion.  Recommend he schedule with a dentist soon since he is overdue.  HYPERTENSION, BENIGN ESSENTIAL - Plan: CBC with Differential/Platelet, Comprehensive metabolic panel -Blood pressure  controlled.  Continue on current medication.  Low-sodium diet.  Recommend exercise.  Chronic kidney disease, unspecified CKD stage - Plan: Comprehensive metabolic panel -Recommend strict blood pressure control.  Avoid NSAIDs.continue to monitor.  HYPERCHOLESTEROLEMIA - Plan: Lipid panel -Continue statin therapy.  Check fasting lipids and follow-up  Polyp of gallbladder - Plan: Ambulatory referral to Gastroenterology -Gallbladder polyp initially found on ultrasound for elevated liver enzymes.  Liver enzymes resolved.  He had a repeat ultrasound which showed the polyp possibly enlarging.  Refer to GI.  He will also be due for follow-up ultrasound in December.  Screen for colon cancer - Plan: Ambulatory referral to Gastroenterology -GI referral made for his first colonoscopy  Screening for prostate cancer - Plan: PSA -Last PSA normal in 2019.  Follow-up pending results  Screening for thyroid disorder - Plan: TSH, T4, free -Follow-up pending results  Screening for heart disease - Plan: EKG 12-Lead -History of negative echo and stress test in 2014.  He is asymptomatic. EKG shows sinus bradycardia, nonspecific T wave changes which were present on his EKG in 2014.  Family history of heart attack - Plan: EKG 12-Lead  Family history of liver cancer - Plan: Ambulatory referral to Gastroenterology

## 2019-08-06 NOTE — Patient Instructions (Addendum)
Your blood pressure is normal. Continue your current medications.   Eat a low sodium diet  Try to get at least 150 minutes of physical activity each week.   You will hear from [facility] to discuss your colon cancer screening.    I recommend getting a dental exam.   We will be in touch with your lab results.    Preventive Care 19-51 Years Old, Male Preventive care refers to lifestyle choices and visits with your health care provider that can promote health and wellness. This includes:  A yearly physical exam. This is also called an annual well check.  Regular dental and eye exams.  Immunizations.  Screening for certain conditions.  Healthy lifestyle choices, such as eating a healthy diet, getting regular exercise, not using drugs or products that contain nicotine and tobacco, and limiting alcohol use. What can I expect for my preventive care visit? Physical exam Your health care provider will check:  Height and weight. These may be used to calculate body mass index (BMI), which is a measurement that tells if you are at a healthy weight.  Heart rate and blood pressure.  Your skin for abnormal spots. Counseling Your health care provider may ask you questions about:  Alcohol, tobacco, and drug use.  Emotional well-being.  Home and relationship well-being.  Sexual activity.  Eating habits.  Work and work Statistician. What immunizations do I need?  Influenza (flu) vaccine  This is recommended every year. Tetanus, diphtheria, and pertussis (Tdap) vaccine  You may need a Td booster every 10 years. Varicella (chickenpox) vaccine  You may need this vaccine if you have not already been vaccinated. Zoster (shingles) vaccine  You may need this after age 11. Measles, mumps, and rubella (MMR) vaccine  You may need at least one dose of MMR if you were born in 1957 or later. You may also need a second dose. Pneumococcal conjugate (PCV13) vaccine  You may need this  if you have certain conditions and were not previously vaccinated. Pneumococcal polysaccharide (PPSV23) vaccine  You may need one or two doses if you smoke cigarettes or if you have certain conditions. Meningococcal conjugate (MenACWY) vaccine  You may need this if you have certain conditions. Hepatitis A vaccine  You may need this if you have certain conditions or if you travel or work in places where you may be exposed to hepatitis A. Hepatitis B vaccine  You may need this if you have certain conditions or if you travel or work in places where you may be exposed to hepatitis B. Haemophilus influenzae type b (Hib) vaccine  You may need this if you have certain risk factors. Human papillomavirus (HPV) vaccine  If recommended by your health care provider, you may need three doses over 6 months. You may receive vaccines as individual doses or as more than one vaccine together in one shot (combination vaccines). Talk with your health care provider about the risks and benefits of combination vaccines. What tests do I need? Blood tests  Lipid and cholesterol levels. These may be checked every 5 years, or more frequently if you are over 95 years old.  Hepatitis C test.  Hepatitis B test. Screening  Lung cancer screening. You may have this screening every year starting at age 30 if you have a 30-pack-year history of smoking and currently smoke or have quit within the past 15 years.  Prostate cancer screening. Recommendations will vary depending on your family history and other risks.  Colorectal cancer  screening. All adults should have this screening starting at age 32 and continuing until age 38. Your health care provider may recommend screening at age 17 if you are at increased risk. You will have tests every 1-10 years, depending on your results and the type of screening test.  Diabetes screening. This is done by checking your blood sugar (glucose) after you have not eaten for a while  (fasting). You may have this done every 1-3 years.  Sexually transmitted disease (STD) testing. Follow these instructions at home: Eating and drinking  Eat a diet that includes fresh fruits and vegetables, whole grains, lean protein, and low-fat dairy products.  Take vitamin and mineral supplements as recommended by your health care provider.  Do not drink alcohol if your health care provider tells you not to drink.  If you drink alcohol: ? Limit how much you have to 0-2 drinks a day. ? Be aware of how much alcohol is in your drink. In the [place] one drink equals one 12 oz bottle of beer (355 mL), one 5 oz glass of wine (148 mL), or one 1 oz glass of hard liquor (44 mL). Lifestyle  Take daily care of your teeth and gums.  Stay active. Exercise for at least 30 minutes on 5 or more days each week.  Do not use any products that contain nicotine or tobacco, such as cigarettes, e-cigarettes, and chewing tobacco. If you need help quitting, ask your health care provider.  If you are sexually active, practice safe sex. Use a condom or other form of protection to prevent STIs (sexually transmitted infections).  Talk with your health care provider about taking a low-dose aspirin every day starting at age 104. What's next?  Go to your health care provider once a year for a well check visit.  Ask your health care provider how often you should have your eyes and teeth checked.  Stay up to date on all vaccines. This information is not intended to replace advice given to you by your health care provider. Make sure you discuss any questions you have with your health care provider. Document Revised: 01/16/2018 Document Reviewed: 01/16/2018 Elsevier Patient Education  2020 [employer].

## 2019-08-07 ENCOUNTER — Encounter: Payer: Self-pay | Admitting: Family Medicine

## 2019-08-07 ENCOUNTER — Ambulatory Visit (INDEPENDENT_AMBULATORY_CARE_PROVIDER_SITE_OTHER): Payer: 59 | Admitting: Family Medicine

## 2019-08-07 ENCOUNTER — Other Ambulatory Visit: Payer: Self-pay

## 2019-08-07 ENCOUNTER — Encounter: Payer: BLUE CROSS/BLUE SHIELD | Admitting: Family Medicine

## 2019-08-07 VITALS — BP 122/78 | HR 50 | Ht 66.5 in | Wt 191.2 lb

## 2019-08-07 DIAGNOSIS — Z125 Encounter for screening for malignant neoplasm of prostate: Secondary | ICD-10-CM | POA: Diagnosis not present

## 2019-08-07 DIAGNOSIS — Z8249 Family history of ischemic heart disease and other diseases of the circulatory system: Secondary | ICD-10-CM

## 2019-08-07 DIAGNOSIS — Z1329 Encounter for screening for other suspected endocrine disorder: Secondary | ICD-10-CM | POA: Diagnosis not present

## 2019-08-07 DIAGNOSIS — Z800 Family history of malignant neoplasm of digestive organs: Secondary | ICD-10-CM

## 2019-08-07 DIAGNOSIS — Z1211 Encounter for screening for malignant neoplasm of colon: Secondary | ICD-10-CM

## 2019-08-07 DIAGNOSIS — I10 Essential (primary) hypertension: Secondary | ICD-10-CM | POA: Diagnosis not present

## 2019-08-07 DIAGNOSIS — Z0000 Encounter for general adult medical examination without abnormal findings: Secondary | ICD-10-CM

## 2019-08-07 DIAGNOSIS — E7800 Pure hypercholesterolemia, unspecified: Secondary | ICD-10-CM

## 2019-08-07 DIAGNOSIS — K824 Cholesterolosis of gallbladder: Secondary | ICD-10-CM | POA: Diagnosis not present

## 2019-08-07 DIAGNOSIS — N189 Chronic kidney disease, unspecified: Secondary | ICD-10-CM | POA: Diagnosis not present

## 2019-08-07 DIAGNOSIS — Z136 Encounter for screening for cardiovascular disorders: Secondary | ICD-10-CM | POA: Diagnosis not present

## 2019-08-08 LAB — CBC WITH DIFFERENTIAL/PLATELET
Basophils Absolute: 0.1 10*3/uL (ref 0.0–0.2)
Basos: 1 %
EOS (ABSOLUTE): 0.1 10*3/uL (ref 0.0–0.4)
Eos: 2 %
Hematocrit: 45.4 % (ref 37.5–51.0)
Hemoglobin: 15.6 g/dL (ref 13.0–17.7)
Immature Grans (Abs): 0 10*3/uL (ref 0.0–0.1)
Immature Granulocytes: 0 %
Lymphocytes Absolute: 1.8 10*3/uL (ref 0.7–3.1)
Lymphs: 41 %
MCH: 30.5 pg (ref 26.6–33.0)
MCHC: 34.4 g/dL (ref 31.5–35.7)
MCV: 89 fL (ref 79–97)
Monocytes Absolute: 0.3 10*3/uL (ref 0.1–0.9)
Monocytes: 7 %
Neutrophils Absolute: 2.2 10*3/uL (ref 1.4–7.0)
Neutrophils: 49 %
Platelets: 245 10*3/uL (ref 150–450)
RBC: 5.11 x10E6/uL (ref 4.14–5.80)
RDW: 13.1 % (ref 11.6–15.4)
WBC: 4.4 10*3/uL (ref 3.4–10.8)

## 2019-08-08 LAB — COMPREHENSIVE METABOLIC PANEL
ALT: 34 IU/L (ref 0–44)
AST: 23 IU/L (ref 0–40)
Albumin/Globulin Ratio: 1.8 (ref 1.2–2.2)
Albumin: 4.6 g/dL (ref 3.8–4.9)
Alkaline Phosphatase: 110 IU/L (ref 48–121)
BUN/Creatinine Ratio: 11 (ref 9–20)
BUN: 16 mg/dL (ref 6–24)
Bilirubin Total: 0.6 mg/dL (ref 0.0–1.2)
CO2: 25 mmol/L (ref 20–29)
Calcium: 9.4 mg/dL (ref 8.7–10.2)
Chloride: 104 mmol/L (ref 96–106)
Creatinine, Ser: 1.48 mg/dL — ABNORMAL HIGH (ref 0.76–1.27)
GFR calc Af Amer: 62 mL/min/{1.73_m2} (ref >59)
GFR calc non Af Amer: 54 mL/min/{1.73_m2} — ABNORMAL LOW (ref >59)
Globulin, Total: 2.5 g/dL (ref 1.5–4.5)
Glucose: 80 mg/dL (ref 65–99)
Potassium: 4 mmol/L (ref 3.5–5.2)
Sodium: 143 mmol/L (ref 134–144)
Total Protein: 7.1 g/dL (ref 6.0–8.5)

## 2019-08-08 LAB — LIPID PANEL
Chol/HDL Ratio: 3.8 ratio (ref 0.0–5.0)
Cholesterol, Total: 161 mg/dL (ref 100–199)
HDL: 42 mg/dL (ref >39)
LDL Chol Calc (NIH): 107 mg/dL — ABNORMAL HIGH (ref 0–99)
Triglycerides: 62 mg/dL (ref 0–149)
VLDL Cholesterol Cal: 12 mg/dL (ref 5–40)

## 2019-08-08 LAB — T4, FREE: Free T4: 1.16 ng/dL (ref 0.82–1.77)

## 2019-08-08 LAB — PSA: Prostate Specific Ag, Serum: 0.7 ng/mL (ref 0.0–4.0)

## 2019-08-08 LAB — TSH: TSH: 1.27 u[IU]/mL (ref 0.450–4.500)

## 2019-08-11 ENCOUNTER — Other Ambulatory Visit: Payer: Self-pay | Admitting: Internal Medicine

## 2019-08-11 DIAGNOSIS — E785 Hyperlipidemia, unspecified: Secondary | ICD-10-CM

## 2019-08-11 NOTE — Progress Notes (Signed)
His blood work is good overall. His kidney function is stable and we will continue to monitor. His LDL is too high for him to be taking his statin everyday and we would like this to be lower. Please ask if he is taking his statin daily. We may need to discuss this again soon.

## 2019-08-11 NOTE — Progress Notes (Signed)
Let's have him return in 8 weeks and recheck his cholesterol in the office fasting. We may need to add another medication.

## 2019-08-13 ENCOUNTER — Encounter: Payer: Self-pay | Admitting: Gastroenterology

## 2019-09-30 ENCOUNTER — Ambulatory Visit (AMBULATORY_SURGERY_CENTER): Payer: Self-pay | Admitting: *Deleted

## 2019-09-30 ENCOUNTER — Other Ambulatory Visit: Payer: Self-pay

## 2019-09-30 ENCOUNTER — Encounter: Payer: Self-pay | Admitting: Gastroenterology

## 2019-09-30 VITALS — Ht 66.5 in | Wt 195.0 lb

## 2019-09-30 DIAGNOSIS — Z1211 Encounter for screening for malignant neoplasm of colon: Secondary | ICD-10-CM

## 2019-09-30 MED ORDER — PEG 3350-KCL-NA BICARB-NACL 420 G PO SOLR: 4000.0000 mL | Freq: Once | ORAL | 0 refills | Status: AC

## 2019-09-30 NOTE — Progress Notes (Signed)
No egg or soy allergy known to patient  No issues with past sedation with any surgeries or procedures no intubation problems in the past  No FH of Malignant Hyperthermia No diet pills per patient No home 02 use per patient  No blood thinners per patient  Pt denies issues with constipation  No A fib or A flutter  EMMI video to pt or via MyChart  COVID 19 guidelines implemented in PV today with Pt and RN   cov vax x 2    Due to the COVID-19 pandemic we are asking patients to follow these guidelines. Please only bring one care partner. Please be aware that your care partner may wait in the car in the parking lot or if they feel like they will be too hot to wait in the car, they may wait in the lobby on the 4th floor. All care partners are required to wear a mask the entire time (we do not have any that we can provide them), they need to practice social distancing, and we will do a Covid check for all patient's and care partners when you arrive. Also we will check their temperature and your temperature. If the care partner waits in their car they need to stay in the parking lot the entire time and we will call them on their cell phone when the patient is ready for discharge so they can bring the car to the front of the building. Also all patient's will need to wear a mask into building.  

## 2019-10-06 ENCOUNTER — Other Ambulatory Visit: Payer: Self-pay

## 2019-10-06 ENCOUNTER — Other Ambulatory Visit: Payer: 59

## 2019-10-06 DIAGNOSIS — E785 Hyperlipidemia, unspecified: Secondary | ICD-10-CM

## 2019-10-07 HISTORY — PX: COLONOSCOPY: SHX174

## 2019-10-07 LAB — LIPID PANEL
Chol/HDL Ratio: 3.7 ratio (ref 0.0–5.0)
Cholesterol, Total: 140 mg/dL (ref 100–199)
HDL: 38 mg/dL — ABNORMAL LOW (ref >39)
LDL Chol Calc (NIH): 86 mg/dL (ref 0–99)
Triglycerides: 80 mg/dL (ref 0–149)
VLDL Cholesterol Cal: 16 mg/dL (ref 5–40)

## 2019-10-07 NOTE — Progress Notes (Signed)
His LDL has improved. Continue eating a low fat, low cholesterol diet and taking his statin.

## 2019-10-14 ENCOUNTER — Other Ambulatory Visit: Payer: Self-pay

## 2019-10-14 ENCOUNTER — Ambulatory Visit (AMBULATORY_SURGERY_CENTER): Payer: 59 | Admitting: Gastroenterology

## 2019-10-14 ENCOUNTER — Encounter: Payer: Self-pay | Admitting: Gastroenterology

## 2019-10-14 VITALS — BP 125/85 | HR 52 | Temp 98.2°F | Resp 16 | Ht 66.5 in | Wt 195.0 lb

## 2019-10-14 DIAGNOSIS — D120 Benign neoplasm of cecum: Secondary | ICD-10-CM | POA: Diagnosis not present

## 2019-10-14 DIAGNOSIS — Z1211 Encounter for screening for malignant neoplasm of colon: Secondary | ICD-10-CM

## 2019-10-14 MED ORDER — SODIUM CHLORIDE 0.9 % IV SOLN: 500.0000 mL | Freq: Once | INTRAVENOUS | Status: DC

## 2019-10-14 NOTE — Progress Notes (Signed)
Report to PACU, RN, vss, BBS= Clear.  

## 2019-10-14 NOTE — Patient Instructions (Addendum)
HANDOUTS PROVIDED ON: Polyps, Hemorrhoids, High Fiber diet and Diverticulosis. Information on Hemorrhoid banding given  The polyps removed today have been sent for pathology.  The results can take 1-3 weeks to receive.  When your next colonoscopy should occur will be based on the pathology results.    You may resume your previous diet and medication schedule.  Thank you for allowing [place] to care for you today!!!    YOU HAD AN ENDOSCOPIC PROCEDURE TODAY AT [place]:   Refer to the procedure report that was given to you for any specific questions about what was found during the examination.  If the procedure report does not answer your questions, please call your gastroenterologist to clarify.  If you requested that your care partner not be given the details of your procedure findings, then the procedure report has been included in a sealed envelope for you to review at your convenience later.  YOU SHOULD EXPECT: Some feelings of bloating in the abdomen. Passage of more gas than usual.  Walking can help get rid of the air that was put into your GI tract during the procedure and reduce the bloating. If you had a lower endoscopy (such as a colonoscopy or flexible sigmoidoscopy) you may notice spotting of blood in your stool or on the toilet paper. If you underwent a bowel prep for your procedure, you may not have a normal bowel movement for a few days.  Please Note:  You might notice some irritation and congestion in your nose or some drainage.  This is from the oxygen used during your procedure.  There is no need for concern and it should clear up in a day or so.  SYMPTOMS TO REPORT IMMEDIATELY:   Following lower endoscopy (colonoscopy or flexible sigmoidoscopy):  Excessive amounts of blood in the stool  Significant tenderness or worsening of abdominal pains  Swelling of the abdomen that is new, acute  Fever of 100F or higher   For urgent or emergent issues, a  gastroenterologist can be reached at any hour by calling [phone]. Do not use MyChart messaging for urgent concerns.    DIET:  We do recommend a small meal at first, but then you may proceed to your regular diet.  Drink plenty of fluids but you should avoid alcoholic beverages for 24 hours.  ACTIVITY:  You should plan to take it easy for the rest of today and you should NOT DRIVE or use heavy machinery until tomorrow (because of the sedation medicines used during the test).    FOLLOW UP: Our staff will call the number listed on your records 48-72 hours following your procedure to check on you and address any questions or concerns that you may have regarding the information given to you following your procedure. If we do not reach you, we will leave a message.  We will attempt to reach you two times.  During this call, we will ask if you have developed any symptoms of COVID 19. If you develop any symptoms (ie: fever, flu-like symptoms, shortness of breath, cough etc.) before then, please call [phone].  If you test positive for Covid 19 in the 2 weeks post procedure, please call and report this information to [place].    If any biopsies were taken you will be contacted by phone or by letter within the next 1-3 weeks.  Please call us at [phone] if you have not heard about the biopsies in 3 weeks.    SIGNATURES/CONFIDENTIALITY:  You and/or your care partner have signed paperwork which will be entered into your electronic medical record.  These signatures attest to the fact that that the information above on your After Visit Summary has been reviewed and is understood.  Full responsibility of the confidentiality of this discharge information lies with you and/or your care-partner.

## 2019-10-14 NOTE — Progress Notes (Signed)
Called to room to assist during endoscopic procedure.  Patient ID and intended procedure confirmed with present staff. Received instructions for my participation in the procedure from the performing physician.  

## 2019-10-14 NOTE — Progress Notes (Signed)
Dr. Fuller Plan discussed hemorrhoids with possible banding with patient in recovery.  Handout given.  Patient verbalized understanding office will call to set up appointment.  Patient had no complications in recovery. LN

## 2019-10-14 NOTE — Progress Notes (Signed)
VS-CW  Pt's states no medical or surgical changes since previsit or office visit.  

## 2019-10-14 NOTE — Op Note (Addendum)
[facility] Patient Name: Damonie Ellenwood Procedure Date: 10/14/2019 8:54 AM MRN: [id number] Endoscopist: Ladene Artist , MD Age: 49 Referring MD:  Date of Birth: 1970/05/14 Gender: Male Account #: [id number] Procedure:                Colonoscopy Indications:              Screening for colorectal malignant neoplasm Medicines:                Monitored Anesthesia Care Procedure:                Pre-Anesthesia Assessment:                           - Prior to the procedure, a History and Physical                            was performed, and patient medications and                            allergies were reviewed. The patient's tolerance of                            previous anesthesia was also reviewed. The risks                            and benefits of the procedure and the sedation                            options and risks were discussed with the patient.                            All questions were answered, and informed consent                            was obtained. Prior Anticoagulants: The patient has                            taken no previous anticoagulant or antiplatelet                            agents. ASA Grade Assessment: II - A patient with                            mild systemic disease. After reviewing the risks                            and benefits, the patient was deemed in                            satisfactory condition to undergo the procedure.                           After obtaining informed consent, the colonoscope  was passed under direct vision. Throughout the                            procedure, the patient's blood pressure, pulse, and                            oxygen saturations were monitored continuously. The                            Colonoscope was introduced through the anus and                            advanced to the the cecum, identified by                            appendiceal orifice and  ileocecal valve. The                            ileocecal valve, appendiceal orifice, and rectum                            were photographed. The quality of the bowel                            preparation was adequate. The colonoscopy was                            performed without difficulty. The patient tolerated                            the procedure well. Scope In: 9:06:50 AM Scope Out: 9:20:21 AM Scope Withdrawal Time: 0 hours 11 minutes 45 seconds  Total Procedure Duration: 0 hours 13 minutes 31 seconds  Findings:                 The perianal and digital rectal examinations were                            normal.                           A 4 mm polyp was found in the cecum. The polyp was                            sessile. The polyp was removed with a cold biopsy                            forceps. Resection and retrieval were complete.                           Multiple medium-mouthed diverticula were found in                            the left colon. There was no evidence of  diverticular bleeding.                           Internal hemorrhoids were found during                            retroflexion. The hemorrhoids were medium-sized and                            Grade I (internal hemorrhoids that do not prolapse).                           The exam was otherwise without abnormality on                            direct and retroflexion views. Complications:            No immediate complications. Estimated blood loss:                            None. Estimated Blood Loss:     Estimated blood loss: none. Impression:               - One 4 mm polyp in the cecum, removed with a cold                            biopsy forceps. Resected and retrieved.                           - Mild diverticulosis in the left colon.                           - Internal hemorrhoids. Recommendation:           - Repeat colonoscopy after studies are complete for                             surveillance based on pathology results.                           - Patient has a contact number available for                            emergencies. The signs and symptoms of potential                            delayed complications were discussed with the                            patient. Return to normal activities tomorrow.                            Written discharge instructions were provided to the                            patient.                           -  High fiber diet.                           - Continue present medications.                           - Await pathology results.                           - Schedule hemorrhoid banding. Ladene Artist, MD 10/14/2019 9:30:05 AM This report has been signed electronically.

## 2019-10-16 ENCOUNTER — Telehealth: Payer: Self-pay | Admitting: *Deleted

## 2019-10-16 NOTE — Telephone Encounter (Signed)
1. Have you developed a fever since your procedure? no  2.   Have you had an respiratory symptoms (SOB or cough) since your procedure? no  3.   Have you tested positive for COVID 19 since your procedure no  4.   Have you had any family members/close contacts diagnosed with the COVID 19 since your procedure?  no   If yes to any of these questions please route to Joylene John, RN and Joella Prince, RN Follow up Call-  Call back number 10/14/2019  Post procedure Call Back phone  # [phone]  Permission to leave phone message Yes  Some recent data might be hidden     Patient questions:  Do you have a fever, pain , or abdominal swelling? No. Pain Score  0 *  Have you tolerated food without any problems? Yes.    Have you been able to return to your normal activities? Yes.    Do you have any questions about your discharge instructions: Diet   No. Medications  No. Follow up visit  No.  Do you have questions or concerns about your Care? No.  Actions: * If pain score is 4 or above: No action needed, pain <4.

## 2019-10-26 ENCOUNTER — Encounter: Payer: Self-pay | Admitting: Gastroenterology

## 2019-10-28 ENCOUNTER — Ambulatory Visit (INDEPENDENT_AMBULATORY_CARE_PROVIDER_SITE_OTHER): Payer: 59 | Admitting: Gastroenterology

## 2019-10-28 ENCOUNTER — Encounter: Payer: Self-pay | Admitting: Gastroenterology

## 2019-10-28 VITALS — BP 122/76 | HR 64 | Ht 66.5 in | Wt 195.8 lb

## 2019-10-28 DIAGNOSIS — K648 Other hemorrhoids: Secondary | ICD-10-CM

## 2019-10-28 NOTE — Patient Instructions (Signed)

## 2019-10-28 NOTE — Progress Notes (Signed)
PROCEDURE NOTE: The patient presents with symptomatic bleeding grade 1 hemorrhoids, requesting rubber band ligation of their hemorrhoidal disease.  All risks, benefits and alternative forms of therapy were described and informed consent was obtained.  The anorectum was pre-medicated during digital exam with 0.125% NTG and 5% lidocaine. In the Left Lateral Decubitus position anoscopic examination revealed grade 1 hemorrhoids in the RP > RA > LL positions and a long anal canal was noted.   The decision was made to band the RP internal hemorrhoid, and the [place] was used to perform band ligation without complication. The band was deployed with notched marker about 2 cm into his anal canal.  Digital anorectal examination was then performed to assure proper positioning of the band and to adjust the banded tissue as required. No adjustment was needed.  No complications were encountered and the patient tolerated the procedure well.  Dietary and behavioral recommendations were given and along with follow-up instructions.   Return for possible additional hemorrhoid banding in 2 - 3 weeks as required.  High fiber diet, Benefiber daily and at least 8 glasses of water daily. The patient was discharged home without pain or other post procedure problems.  Tylenol ES 2 po q6h prn rectal pain for next few days.

## 2019-11-12 ENCOUNTER — Ambulatory Visit (INDEPENDENT_AMBULATORY_CARE_PROVIDER_SITE_OTHER): Payer: 59 | Admitting: Gastroenterology

## 2019-11-12 ENCOUNTER — Encounter: Payer: Self-pay | Admitting: Gastroenterology

## 2019-11-12 VITALS — BP 100/70 | HR 60 | Ht 66.5 in | Wt 192.0 lb

## 2019-11-12 DIAGNOSIS — K648 Other hemorrhoids: Secondary | ICD-10-CM

## 2019-11-12 DIAGNOSIS — K640 First degree hemorrhoids: Secondary | ICD-10-CM

## 2019-11-12 NOTE — Patient Instructions (Signed)
Start over the counter Miralax mixing 17 grams in 8 oz of water 1-2 x daily. Also start Benefiber taking 2 tablespoons daily with adequate amount of water daily.  If your constipation is improving then remain on Benefiber.   HEMORRHOID BANDING PROCEDURE    FOLLOW-UP CARE   1. The procedure you have had should have been relatively painless since the banding of the area involved does not have nerve endings and there is no pain sensation.  The rubber band cuts off the blood supply to the hemorrhoid and the band may fall off as soon as 48 hours after the banding (the band may occasionally be seen in the toilet bowl following a bowel movement). You may notice a temporary feeling of fullness in the rectum which should respond adequately to plain Tylenol or Motrin.  2. Following the banding, avoid strenuous exercise that evening and resume full activity the next day.  A sitz bath (soaking in a warm tub) or bidet is soothing, and can be useful for cleansing the area after bowel movements.     3. To avoid constipation, take two tablespoons of natural wheat bran, natural oat bran, flax, Benefiber or any over the counter fiber supplement and increase your water intake to 7-8 glasses daily.    4. Unless you have been prescribed anorectal medication, do not put anything inside your rectum for two weeks: No suppositories, enemas, fingers, etc.  5. Occasionally, you may have more bleeding than usual after the banding procedure.  This is often from the untreated hemorrhoids rather than the treated one.  Don't be concerned if there is a tablespoon or so of blood.  If there is more blood than this, lie flat with your bottom higher than your head and apply an ice pack to the area. If the bleeding does not stop within a half an hour or if you feel faint, call our office at [phone] or go to the emergency room.  6. Problems are not common; however, if there is a substantial amount of bleeding, severe pain,  chills, fever or difficulty passing urine (very rare) or other problems, you should call us at [phone] or report to the nearest emergency room.  7. Do not stay seated continuously for more than 2-3 hours for a day or two after the procedure.  Tighten your buttock muscles 10-15 times every two hours and take 10-15 deep breaths every 1-2 hours.  Do not spend more than a few minutes on the toilet if you cannot empty your bowel; instead re-visit the toilet at a later time.    Thank you for choosing me and [facility].  Pricilla Riffle. Dagoberto Ligas., MD., [facility]

## 2019-11-12 NOTE — Progress Notes (Signed)
PROCEDURE NOTE: The patient presents with symptomatic bleeding grade 1 hemorrhoids, requesting rubber band ligation of their hemorrhoidal disease.  All risks, benefits and alternative forms of therapy were described and informed consent was obtained.  RP hemorrhoid banded on 10/28/2019 with a long anal canal noted. He has noted worsening constipation since banding #1, otherwise he has done well since then. No rectal bleeding since banding.   The anorectum was pre-medicated during digital exam with 0.125% NTG and 5% lidocaine. A long anal canal with anal discomfort on DRE noted but no fissure or other abnormalities were noted.   The decision was made to band the RA internal hemorrhoid, and the [place] was used to perform band ligation without complication. He was uncomfortable during DRE and insertion of the banding device. He appeared much more comfortable as soon as the banding device was removed.  Digital anorectal examination was then performed to assure proper positioning of the band and to adjust the banded tissue as required. No adjustment was needed.  No complications were encountered and the patient tolerated the procedure well.  Dietary and behavioral recommendations were given and along with follow-up instructions.   Given discomfort during DRE and banding device insertion and current resolution of his rectal bleeding will defer hemorrhoid banding #3. If he has persistent rectal bleeding will reassess for additional banding in the future.   High fiber diet, Benefiber daily and at least 8 glasses of water daily long term. Miralax 1-2 scoops per day when constipated long term.  Call or Mychart message [place] to report symptoms in 2 weeks.  The patient was discharged home without pain or other post procedure problems.

## 2020-01-13 ENCOUNTER — Encounter: Payer: 59 | Admitting: Family Medicine

## 2020-02-09 ENCOUNTER — Other Ambulatory Visit: Payer: Self-pay

## 2020-02-09 ENCOUNTER — Encounter: Payer: Self-pay | Admitting: Medical

## 2020-02-09 ENCOUNTER — Ambulatory Visit (INDEPENDENT_AMBULATORY_CARE_PROVIDER_SITE_OTHER): Payer: BC Managed Care – PPO | Admitting: Medical

## 2020-02-09 VITALS — BP 110/80 | HR 78 | Temp 98.5°F | Ht 66.5 in | Wt 196.2 lb

## 2020-02-09 DIAGNOSIS — T783XXA Angioneurotic edema, initial encounter: Secondary | ICD-10-CM | POA: Diagnosis not present

## 2020-02-09 DIAGNOSIS — L509 Urticaria, unspecified: Secondary | ICD-10-CM

## 2020-02-09 MED ORDER — PREDNISONE 10 MG PO TABS: ORAL_TABLET | ORAL | 0 refills | Status: DC

## 2020-02-09 MED ORDER — AMLODIPINE BESYLATE 5 MG PO TABS: 5.0000 mg | ORAL_TABLET | Freq: Every day | ORAL | 2 refills | Status: DC

## 2020-02-09 NOTE — Progress Notes (Signed)
Subjective:  Joshua Hickman is a 50 y.o. male who presents for Chief Complaint  Patient presents with  . Urticaria    All over started yesterday     Here for hives and swelling of lips.  Started yesterday out of the blue.  No recent change in foods, hygiene products, no new chemical exposure.  Yesterday his lips were quite swollen not as bad so far this morning.  He has hives on his chest, arms, legs.  Very itchy.  Using some Benadryl which has helped.  No prior similar.   No other aggravating or relieving factors.    No other c/o.  Past Medical History:  Diagnosis Date  . Chronic kidney disease    CKD stable   . Eczema   . Elevated liver enzymes 01/06/2018  . Hemorrhoid   . History of nuclear stress test 03/29/2009   normal exam without evidence of exercise-induced sichemia  . Hyperlipidemia   . Hypertension     Family History  Problem Relation Age of Onset  . Heart attack Father 47  . Hypertension Brother        also abdominal cancer  . Liver cancer Brother   . Brain cancer Maternal Aunt   . Cancer Cousin        blood cancer- died age 30-7 yo   . Colon polyps Neg Hx   . Colon cancer Neg Hx   . Esophageal cancer Neg Hx   . Rectal cancer Neg Hx   . Stomach cancer Neg Hx      The following portions of the patient's history were reviewed and updated as appropriate: allergies, current medications, past family history, past medical history, past social history, past surgical history and problem list.  ROS Otherwise as in subjective above  Objective: BP 110/80   Pulse 78   Temp 98.5 F (36.9 C)   Ht 5' 6.5" (1.689 m)   Wt 196 lb 3.2 oz (89 kg)   SpO2 97%   BMI 31.19 kg/m    Wt Readings from Last 3 Encounters:  02/09/20 196 lb 3.2 oz (89 kg)  11/12/19 192 lb (87.1 kg)  10/28/19 195 lb 12.8 oz (88.8 kg)   BP Readings from Last 3 Encounters:  02/09/20 110/80  11/12/19 100/70  10/28/19 122/76    General appearance: alert, no distress, well developed, well  nourished Skin: Scattered urticarial hives on bilateral arms legs and chest HEENT: normocephalic, sclerae anicteric, conjunctiva pink and moist, TMs pearly, nares patent, no discharge or erythema, pharynx normal Oral cavity: Lips with mild angioedema upper and lower, no tongue angioedema, MMM, no lesions Neck: supple, no lymphadenopathy, no thyromegaly, no masses Heart: RRR, normal S1, S2, no murmurs Lungs: CTA bilaterally, no wheezes, rhonchi, or rales Pulses: 2+ radial pulses, 2+ pedal pulses, normal cap refill Ext: no edema   Assessment: Encounter Diagnoses  Name Primary?  . Angioedema, initial encounter Yes  . Hives      Plan: We discussed his findings and possible differential.  We discussed the numerous things that can cause hives.  I reviewed his recent colonoscopy showing tubular adenoma, PSA normal this past year, he does have abnormal kidney function likely due to longstanding high blood pressure, the rest of labs in July 2021 unremarkable  No new concern for HIV or STD  I suspect his symptoms are related to lisinopril  Due to hives and angioedema, STOP lisinopril HCT.    It is very likely that the Lisinopril is causing allergic  reaction  For the next 4-5 days, use Benadryl over the counter 25mg  or half dose of 12.5mg  if it makes you too sleepy.   You can use Benadryl every 4-6 hours for rash and swelling and itching  Your symptoms should improved over the next 3-5 days  If worse rash in the next 48 hours, then begin Prednisone.   Prednisone is a steroid tablet.      Don't take any blood pressure pill today.  Starting tomorrow you can begin Amlodipine 5mg  tablet for blood pressure daily.   This medication is not associated with angioedema swelling.   You will need to follow up with Joshua Hickman in a few weeks assuming the rash and swelling has resolved, and assuming you have been on the new blood pressure pill 3- 4 weeks.   Joshua Hickman was seen today for  urticaria.  Diagnoses and all orders for this visit:  Angioedema, initial encounter  Hives  Other orders -     amLODipine (NORVASC) 5 MG tablet; Take 1 tablet (5 mg total) by mouth daily. -     predniSONE (DELTASONE) 10 MG tablet; 6/5/4/3/2/1 taper    Follow up: 3 to 4 weeks

## 2020-02-09 NOTE — Patient Instructions (Signed)
Due to hives and angioedema, STOP lisinopril HCT.    It is very likely that the Lisinopril is causing allergic reaction  For the next 4-5 days, use Benadryl over the counter 25mg  or half dose of 12.5mg  if it makes you too sleepy.   You can use Benadryl every 4-6 hours for rash and swelling and itching  Your symptoms should improved over the next 3-5 days  If worse rash in the next 48 hours, then begin Prednisone.   Prednisone is a steroid tablet.      Don't take any blood pressure pill today.  Starting tomorrow you can begin Amlodipine 5mg  tablet for blood pressure daily.   This medication is not associated with angioedema swelling.    You will need to follow up with Vickie in a few weeks assuming the rash and swelling has resolved, and assuming you have been on the new blood pressure pill 3- 4 weeks.     Angioedema  Angioedema is sudden swelling in the body. The swelling can happen in any part of the body. It often happens on the skin and causes itchy, bumpy patches (hives) to form. This condition may:  Happen only one time.  Happen more than one time. It may come back at random times.  Keep coming back for a number of years. Someday it may stop coming back. Follow these instructions at home:  Take over-the-counter and prescription medicines only as told by your doctor.  If you were given medicines for emergency allergy treatment, always carry them with you.  Wear a medical bracelet as told by your doctor.  Avoid the things that cause your attacks (triggers).  If this condition was passed to you from your parents and you want to have kids, talk to your doctor. Your kids may also have this condition. Contact a doctor if:  You have another attack.  Your attacks happen more often, even after you take steps to prevent them.  This condition was passed to you by your parents and you want to have kids. Get help right away if:  Your mouth, tongue, or lips get very  swollen.  You have trouble breathing.  You have trouble swallowing.  You pass out (faint). This information is not intended to replace advice given to you by your health care provider. Make sure you discuss any questions you have with your health care provider. Document Revised: 01/04/2017 Document Reviewed: 08/02/2015 Elsevier Patient Education  2020 01/06/2017.

## 2020-02-12 ENCOUNTER — Other Ambulatory Visit: Payer: Self-pay | Admitting: Medical

## 2020-02-12 ENCOUNTER — Telehealth: Payer: Self-pay | Admitting: Family Medicine

## 2020-02-12 NOTE — Telephone Encounter (Signed)
Shane please advise 

## 2020-02-12 NOTE — Telephone Encounter (Signed)
Looks like Joshua Hickman saw him so I am not aware of what is going on. Please ask Joshua Hickman about this. Does he need to come in or do a virtual?

## 2020-02-12 NOTE — Telephone Encounter (Signed)
Please call patient states he is not getting any better Still itching really bad and continuing to break out   Please call

## 2020-02-12 NOTE — Telephone Encounter (Signed)
Pt states he was seen today with a [facility] doctor as he couldn't stand it anymore. He was given prednisone  20mg  and cetirizine 10mg 

## 2020-02-12 NOTE — Telephone Encounter (Signed)
Have him come by for shot of Depo Medrol 80 mg IM if not much improved.  I assume he has been doing benadryl [place] prednisone oral correct?  If way worse, offer appt to one of [place]

## 2020-02-15 ENCOUNTER — Other Ambulatory Visit: Payer: Self-pay | Admitting: Physician Assistant

## 2020-02-15 DIAGNOSIS — R2232 Localized swelling, mass and lump, left upper limb: Secondary | ICD-10-CM

## 2020-02-16 ENCOUNTER — Other Ambulatory Visit: Payer: Self-pay

## 2020-02-16 ENCOUNTER — Ambulatory Visit (INDEPENDENT_AMBULATORY_CARE_PROVIDER_SITE_OTHER): Payer: BC Managed Care – PPO | Admitting: Family Medicine

## 2020-02-16 ENCOUNTER — Encounter: Payer: Self-pay | Admitting: Family Medicine

## 2020-02-16 VITALS — BP 124/82 | HR 62 | Temp 97.9°F | Wt 200.2 lb

## 2020-02-16 DIAGNOSIS — N189 Chronic kidney disease, unspecified: Secondary | ICD-10-CM

## 2020-02-16 DIAGNOSIS — K824 Cholesterolosis of gallbladder: Secondary | ICD-10-CM

## 2020-02-16 DIAGNOSIS — E7800 Pure hypercholesterolemia, unspecified: Secondary | ICD-10-CM | POA: Diagnosis not present

## 2020-02-16 DIAGNOSIS — Z23 Encounter for immunization: Secondary | ICD-10-CM

## 2020-02-16 DIAGNOSIS — I10 Essential (primary) hypertension: Secondary | ICD-10-CM

## 2020-02-16 NOTE — Patient Instructions (Signed)
Preventing High Cholesterol Cholesterol is a white, waxy substance similar to fat that the human body needs to help build cells. The liver makes all the cholesterol that a person's body needs. Having high cholesterol (hypercholesterolemia) increases your risk for heart disease and stroke. Extra or excess cholesterol comes from the food that you eat. High cholesterol can often be prevented with diet and lifestyle changes. If you already have high cholesterol, you can control it with diet, lifestyle changes, and medicines. How can high cholesterol affect me? If you have high cholesterol, fatty deposits (plaques) may build up on the walls of your blood vessels. The blood vessels that carry blood away from your heart are called arteries. Plaques make the arteries narrower and stiffer. This in turn can:  Restrict or block blood flow and cause blood clots to form.  Increase your risk for heart attack and stroke. What can increase my risk for high cholesterol? This condition is more likely to develop in people who:  Eat foods that are high in saturated fat or cholesterol. Saturated fat is mostly found in foods that come from animal sources.  Are overweight.  Are not getting enough exercise.  Have a family history of high cholesterol (familial hypercholesterolemia). What actions can I take to prevent this? Nutrition  Eat less saturated fat.  Avoid trans fats (partially hydrogenated oils). These are often found in margarine and in some baked goods, fried foods, and snacks bought in packages.  Avoid precooked or cured meat, such as bacon, sausages, or meat loaves.  Avoid foods and drinks that have added sugars.  Eat more fruits, vegetables, and whole grains.  Choose healthy sources of protein, such as fish, poultry, lean cuts of red meat, beans, peas, lentils, and nuts.  Choose healthy sources of fat, such as: ? Nuts. ? Vegetable oils, especially olive oil. ? Fish that have healthy fats,  such as omega-3 fatty acids. These fish include mackerel or salmon.   Lifestyle  Lose weight if you are overweight. Maintaining a healthy body mass index (BMI) can help prevent or control high cholesterol. It can also lower your risk for diabetes and high blood pressure. Ask your health care provider to help you with a diet and exercise plan to lose weight safely.  Do not use any products that contain nicotine or tobacco, such as cigarettes, e-cigarettes, and chewing tobacco. If you need help quitting, ask your health care provider. Alcohol use  Do not drink alcohol if: ? Your health care provider tells you not to drink. ? You are pregnant, may be pregnant, or are planning to become pregnant.  If you drink alcohol: ? Limit how much you use to:  0-1 drink a day for women.  0-2 drinks a day for men. ? Be aware of how much alcohol is in your drink. In the [place] one drink equals one 12 oz bottle of beer (355 mL), one 5 oz glass of wine (148 mL), or one 1 oz glass of hard liquor (44 mL). Activity  Get enough exercise. Do exercises as told by your health care provider.  Each week, do at least 150 minutes of exercise that takes a medium level of effort (moderate-intensity exercise). This kind of exercise: ? Makes your heart beat faster while allowing you to still be able to talk. ? Can be done in short sessions several times a day or longer sessions a few times a week. For example, on 5 days each week, you could walk fast or ride   your bike 3 times a day for 10 minutes each time.   Medicines  Your health care provider may recommend medicines to help lower cholesterol. This may be a medicine to lower the amount of cholesterol that your liver makes. You may need medicine if: ? Diet and lifestyle changes have not lowered your cholesterol enough. ? You have high cholesterol and other risk factors for heart disease or stroke.  Take over-the-counter and prescription medicines only as told by your  health care provider. General information  Manage your risk factors for high cholesterol. Talk with your health care provider about all your risk factors and how to lower your risk.  Manage other conditions that you have, such as diabetes or high blood pressure (hypertension).  Have blood tests to check your cholesterol levels at regular points in time as told by your health care provider.  Keep all follow-up visits as told by your health care provider. This is important. Where to find more information  American Heart Association: www.heart.org  National Heart, Lung, and Blood Institute: www.nhlbi.nih.gov Summary  High cholesterol increases your risk for heart disease and stroke. By keeping your cholesterol level low, you can reduce your risk for these conditions.  High cholesterol can often be prevented with diet and lifestyle changes.  Work with your health care provider to manage your risk factors, and have your blood tested regularly. This information is not intended to replace advice given to you by your health care provider. Make sure you discuss any questions you have with your health care provider. Document Revised: 11/04/2018 Document Reviewed: 11/04/2018 Elsevier Patient Education  2021 Elsevier Inc.  

## 2020-02-16 NOTE — Progress Notes (Signed)
Subjective:    Patient ID: Joshua Hickman, male    DOB: [birth year], 50 y.o.   MRN: [id number]  HPI Chief Complaint  Patient presents with  . other    Med check had hives last week, legs felt puffy last night   Is here for a 52-month medication management visit.  Last week he had hives and angioedema presumably related to lisinopril.  He was seen by my colleague Dorothea Ogle, PA for this.  He was later that week also seen by his previous PCP with [facility]. His lisinopril was stopped immediately and he was switched to amlodipine for hypertension. States he started the amlodipine 3 days ago.  States for the past 2 days he has noticed some tightness and swelling in his legs.  States this morning they feel back to normal.  He was prescribed and is still taking prednisone and Zyrtec. No longer taking Benadryl at night.  Reports feeling essentially back to baseline.  We are following CKD.  He has never seen a nephrologist.  HL- LDL 86 on 10/06/2019 and is taking atorvastatin 40 mg daily.  States PCP at [facility] ordered a follow-up US for gallbladder polyp and he is supposed to schedule this. States the PCP at [facility] also ordered an ultrasound for the mass on his left shoulder that seems to be getting larger and appears consistent with a lipoma but he would like to get it checked to make sure.  States it is not bothering him and he is not concerned about how it looks cosmetically.  He would like to get his COVID booster today.  He would like a Product/process development scientist.  Denies fever, chills, dizziness, chest pain, palpitations, shortness of breath, abdominal pain, N/V/D, urinary symptoms.    Reviewed allergies, medications, past medical, surgical, family, and social history.   Review of Systems Pertinent positives and negatives in the history of present illness.     Objective:   Physical Exam BP 124/82   Pulse 62   Temp 97.9 F (36.6 C)   Wt 200 lb 3.2  oz (90.8 kg)   BMI 31.83 kg/m   Alert and in no distress. Cardiac exam shows a regular rhythm without murmurs or gallops. Lungs are clear to auscultation. Extremities without edema. Skin is warm and dry.        Assessment & Plan:  Need for COVID-19 vaccine - Plan: Pfizer SARS-COV-2 Vaccine  HYPERTENSION, BENIGN ESSENTIAL - Plan: CBC with Differential/Platelet, Comprehensive metabolic panel  HYPERCHOLESTEROLEMIA - Plan: Lipid panel  Chronic kidney disease, unspecified CKD stage - Plan: CBC with Differential/Platelet, Comprehensive metabolic panel  Polyp of gallbladder  Reviewed notes regarding recent allergic reaction most likely lisinopril related. I will add this to his allergy list. He is still taking prednisone and Zyrtec that was prescribed by PCP at [facility] in [place].  Reviewed notes from [facility] PCP office where they are managing the lipoma type mass on his left anterior shoulder and have ordered an ultrasound. They have also ordered an ultrasound for follow-up on gallbladder polyp. Discussed with the patient that they would get the results from these tests. His blood pressure is stable so he will continue on his current medication.  Discussed that the swelling in his lower legs may have been due to starting on amlodipine.  He has back to baseline today. They are monitoring CKD and I will check this today. Doing well with cholesterol medication and he is working on  his diet.  He has some questions about what he can eat on a low-cholesterol diet.  A handout will be provided.  Check lipids and follow-up As he requests, a [place] booster will be given. Discussed potential side effects.  Discussed that having 2 PCP offices can become confusing and is not preferred for continuity of care. He is welcome to choose whomever he prefers for his primary care.

## 2020-02-17 LAB — CBC WITH DIFFERENTIAL/PLATELET
Basophils Absolute: 0 10*3/uL (ref 0.0–0.2)
Basos: 0 %
EOS (ABSOLUTE): 0.1 10*3/uL (ref 0.0–0.4)
Eos: 1 %
Hematocrit: 47.1 % (ref 37.5–51.0)
Hemoglobin: 15.9 g/dL (ref 13.0–17.7)
Immature Grans (Abs): 0.1 10*3/uL (ref 0.0–0.1)
Immature Granulocytes: 2 %
Lymphocytes Absolute: 1.4 10*3/uL (ref 0.7–3.1)
Lymphs: 16 %
MCH: 30.1 pg (ref 26.6–33.0)
MCHC: 33.8 g/dL (ref 31.5–35.7)
MCV: 89 fL (ref 79–97)
Monocytes Absolute: 0.4 10*3/uL (ref 0.1–0.9)
Monocytes: 4 %
Neutrophils Absolute: 6.9 10*3/uL (ref 1.4–7.0)
Neutrophils: 77 %
Platelets: 323 10*3/uL (ref 150–450)
RBC: 5.29 x10E6/uL (ref 4.14–5.80)
RDW: 13.1 % (ref 11.6–15.4)
WBC: 8.9 10*3/uL (ref 3.4–10.8)

## 2020-02-17 LAB — LIPID PANEL
Chol/HDL Ratio: 3.7 ratio (ref 0.0–5.0)
Cholesterol, Total: 179 mg/dL (ref 100–199)
HDL: 49 mg/dL (ref >39)
LDL Chol Calc (NIH): 112 mg/dL — ABNORMAL HIGH (ref 0–99)
Triglycerides: 99 mg/dL (ref 0–149)
VLDL Cholesterol Cal: 18 mg/dL (ref 5–40)

## 2020-02-17 LAB — COMPREHENSIVE METABOLIC PANEL
ALT: 90 IU/L — ABNORMAL HIGH (ref 0–44)
AST: 31 IU/L (ref 0–40)
Albumin/Globulin Ratio: 2.1 (ref 1.2–2.2)
Albumin: 4.8 g/dL (ref 3.8–4.9)
Alkaline Phosphatase: 141 IU/L — ABNORMAL HIGH (ref 44–121)
BUN/Creatinine Ratio: 16 (ref 9–20)
BUN: 25 mg/dL — ABNORMAL HIGH (ref 6–24)
Bilirubin Total: 0.8 mg/dL (ref 0.0–1.2)
CO2: 28 mmol/L (ref 20–29)
Calcium: 9.6 mg/dL (ref 8.7–10.2)
Chloride: 100 mmol/L (ref 96–106)
Creatinine, Ser: 1.6 mg/dL — ABNORMAL HIGH (ref 0.76–1.27)
GFR calc Af Amer: 57 mL/min/{1.73_m2} — ABNORMAL LOW (ref >59)
GFR calc non Af Amer: 49 mL/min/{1.73_m2} — ABNORMAL LOW (ref >59)
Globulin, Total: 2.3 g/dL (ref 1.5–4.5)
Glucose: 84 mg/dL (ref 65–99)
Potassium: 4.3 mmol/L (ref 3.5–5.2)
Sodium: 143 mmol/L (ref 134–144)
Total Protein: 7.1 g/dL (ref 6.0–8.5)

## 2020-02-17 NOTE — Progress Notes (Signed)
His LDL or bad cholesterol is higher than we would expect or like especially since he is taking a high dose cholesterol medication.  Has he missed any doses of this medication in the past few weeks? Make sure he is limiting foods high in fat. Also his liver function tests are elevated and we need to keep an eye on this.  Lets recheck his liver function in 4 weeks. Make sure he is avoiding alcohol and eating a low-fat diet can help this. His kidney function is still relatively stable so we will hold off on referring him to the kidney specialist at this time.

## 2020-02-29 ENCOUNTER — Other Ambulatory Visit: Payer: Self-pay

## 2020-02-29 ENCOUNTER — Other Ambulatory Visit: Payer: Self-pay | Admitting: Occupational Medicine

## 2020-02-29 ENCOUNTER — Ambulatory Visit: Payer: Self-pay

## 2020-02-29 DIAGNOSIS — M25562 Pain in left knee: Secondary | ICD-10-CM

## 2020-03-09 ENCOUNTER — Other Ambulatory Visit: Payer: Self-pay

## 2020-03-09 ENCOUNTER — Ambulatory Visit: Payer: BC Managed Care – PPO | Admitting: Family Medicine

## 2020-03-09 ENCOUNTER — Encounter: Payer: Self-pay | Admitting: Family Medicine

## 2020-03-09 VITALS — BP 120/80 | HR 82 | Temp 98.5°F | Wt 193.8 lb

## 2020-03-09 DIAGNOSIS — R300 Dysuria: Secondary | ICD-10-CM

## 2020-03-09 DIAGNOSIS — R8290 Unspecified abnormal findings in urine: Secondary | ICD-10-CM | POA: Diagnosis not present

## 2020-03-09 DIAGNOSIS — R319 Hematuria, unspecified: Secondary | ICD-10-CM | POA: Diagnosis not present

## 2020-03-09 DIAGNOSIS — R3915 Urgency of urination: Secondary | ICD-10-CM

## 2020-03-09 DIAGNOSIS — R351 Nocturia: Secondary | ICD-10-CM

## 2020-03-09 DIAGNOSIS — R350 Frequency of micturition: Secondary | ICD-10-CM | POA: Diagnosis not present

## 2020-03-09 LAB — POCT URINALYSIS DIP (PROADVANTAGE DEVICE)
Bilirubin, UA: NEGATIVE
Glucose, UA: NEGATIVE mg/dL
Ketones, POC UA: NEGATIVE mg/dL
Leukocytes, UA: NEGATIVE
Nitrite, UA: NEGATIVE
Protein Ur, POC: NEGATIVE mg/dL
Specific Gravity, Urine: 1.025
Urobilinogen, Ur: NEGATIVE
pH, UA: 6 (ref 5.0–8.0)

## 2020-03-09 LAB — COMPREHENSIVE METABOLIC PANEL
ALT: 141 IU/L — ABNORMAL HIGH (ref 0–44)
AST: 38 IU/L (ref 0–40)
Albumin/Globulin Ratio: 2 (ref 1.2–2.2)
Albumin: 5 g/dL — ABNORMAL HIGH (ref 3.8–4.9)
Alkaline Phosphatase: 149 IU/L — ABNORMAL HIGH (ref 44–121)
BUN/Creatinine Ratio: 13 (ref 9–20)
BUN: 21 mg/dL (ref 6–24)
Bilirubin Total: 0.6 mg/dL (ref 0.0–1.2)
CO2: 25 mmol/L (ref 20–29)
Calcium: 10 mg/dL (ref 8.7–10.2)
Chloride: 101 mmol/L (ref 96–106)
Creatinine, Ser: 1.61 mg/dL — ABNORMAL HIGH (ref 0.76–1.27)
GFR calc Af Amer: 56 mL/min/{1.73_m2} — ABNORMAL LOW (ref >59)
GFR calc non Af Amer: 49 mL/min/{1.73_m2} — ABNORMAL LOW (ref >59)
Globulin, Total: 2.5 g/dL (ref 1.5–4.5)
Glucose: 88 mg/dL (ref 65–99)
Potassium: 4.4 mmol/L (ref 3.5–5.2)
Sodium: 141 mmol/L (ref 134–144)
Total Protein: 7.5 g/dL (ref 6.0–8.5)

## 2020-03-09 LAB — HEMOGLOBIN A1C
Est. average glucose Bld gHb Est-mCnc: 131 mg/dL
Hgb A1c MFr Bld: 6.2 % — ABNORMAL HIGH (ref 4.8–5.6)

## 2020-03-09 MED ORDER — SULFAMETHOXAZOLE-TRIMETHOPRIM 800-160 MG PO TABS: 1.0000 | ORAL_TABLET | Freq: Two times a day (BID) | ORAL | 0 refills | Status: DC

## 2020-03-09 NOTE — Patient Instructions (Signed)
Take the antibiotic as prescribed. Increase your water intake.   I will be in touch with your results.

## 2020-03-09 NOTE — Progress Notes (Signed)
   Subjective:    Patient ID: Joshua Hickman, male    DOB: Jun 29, 1970, 50 y.o.   MRN: [id number]  HPI Chief Complaint  Patient presents with  . possible UTI    Possible UTI- odor, no pain, just tingling sensation   Complains of a tingling sensation at the tip of his penis.  He also has been getting up 3-4 times at night to urinate for the past 2 months or so.  Also reports urgency and hesitancy.  No increased thirst.   States he had a UTI in the distant past. Denies history of STIs or BPH. No history of kidney stones.   Denies any sexual activity in the past 2 months.   Denies fever, chills, chest pain, palpitations, shortness of breath, abdominal pain, N/V/D, LE edema.   Recent procedure for hemorrhoids. States he had a rectal exam then. Does not want one today.   No history of diabetes.   Reviewed allergies, medications, past medical, surgical, family, and social history.   Review of Systems Pertinent positives and negatives in the history of present illness.     Objective:   Physical Exam BP 120/80   Pulse 82   Temp 98.5 F (36.9 C)   Wt 193 lb 12.8 oz (87.9 kg)   BMI 30.81 kg/m   Alert and in no distress. Cardiac exam shows a regular rhythm without murmurs or gallops. Lungs are clear to auscultation. Abdomen is soft, non distended, normal BS, non tender, no rebound or guarding. No CVAT.  Refuses GU exam.       Assessment & Plan:  Hematuria, unspecified type - Plan: Urine Culture, Urine Microscopic, GC/Chlamydia Probe Amp, Trichomonas vaginalis, RNA, PSA  Dysuria - Plan: Urine Culture, Urine Microscopic, GC/Chlamydia Probe Amp, Trichomonas vaginalis, RNA, sulfamethoxazole-trimethoprim (BACTRIM DS) 800-160 MG tablet, PSA  Abnormal urine odor - Plan: POCT Urinalysis DIP (Proadvantage Device)  Urinary frequency - Plan: Urine Culture, Urine Microscopic, GC/Chlamydia Probe Amp, Trichomonas vaginalis, RNA, Comprehensive metabolic panel, Hemoglobin A1c,  sulfamethoxazole-trimethoprim (BACTRIM DS) 800-160 MG tablet, PSA  Urinary urgency - Plan: Urine Culture, GC/Chlamydia Probe Amp, Trichomonas vaginalis, RNA, sulfamethoxazole-trimethoprim (BACTRIM DS) 800-160 MG tablet, PSA  Nocturia - Plan: Comprehensive metabolic panel, Hemoglobin A1c, sulfamethoxazole-trimethoprim (BACTRIM DS) 800-160 MG tablet, PSA  Urinalysis dipstick shows 1+ blood today.  I will send this for microscopic urinalysis as well as a urine culture. Screening for GC/CT and trichomonas. I will also screen him for diabetes. Encouraged better hydration. Discussed that he may have acute prostatitis and I will treat him as such.  He declined a genital/rectal exam today.  Reports recent rectal exam done at GI but explained that they may not have examined his prostate. He will follow-up with me in 2 weeks and I will follow up sooner with his lab results or as needed. Consider referral to urology if needed.

## 2020-03-10 LAB — URINALYSIS, MICROSCOPIC ONLY
Bacteria, UA: NONE SEEN
Casts: NONE SEEN /lpf
Epithelial Cells (non renal): NONE SEEN /hpf (ref 0–10)
WBC, UA: NONE SEEN /hpf (ref 0–5)

## 2020-03-10 LAB — PSA: Prostate Specific Ag, Serum: 0.7 ng/mL (ref 0.0–4.0)

## 2020-03-11 LAB — TRICHOMONAS VAGINALIS, PROBE AMP: Trich vag by NAA: NEGATIVE

## 2020-03-11 LAB — GC/CHLAMYDIA PROBE AMP
Chlamydia trachomatis, NAA: NEGATIVE
Neisseria Gonorrhoeae by PCR: NEGATIVE

## 2020-03-11 LAB — URINE CULTURE: Organism ID, Bacteria: NO GROWTH

## 2020-03-14 ENCOUNTER — Encounter: Payer: Self-pay | Admitting: Family Medicine

## 2020-03-14 DIAGNOSIS — Z800 Family history of malignant neoplasm of digestive organs: Secondary | ICD-10-CM | POA: Insufficient documentation

## 2020-03-14 DIAGNOSIS — R7303 Prediabetes: Secondary | ICD-10-CM

## 2020-03-14 HISTORY — DX: Family history of malignant neoplasm of digestive organs: Z80.0

## 2020-03-14 HISTORY — DX: Prediabetes: R73.03

## 2020-03-14 NOTE — Progress Notes (Signed)
His urine culture does not show an infection, no blood was seen in his urine after microscopic study was done, and he is negative for sexually transmitted infections. Is he still having urinary symptoms? His Hgb A1c is 6.2% which is still in prediabetes range. I am concerned that his liver function test is much higher than 3 weeks ago. Has he been eating or drinking differently? Any recent alcohol use? I recommend he follow up with Dr. Fuller Plan, GI, about this. His abdominal US on 02/18/20 shows a normal liver with gallbladder polyp.

## 2020-03-23 ENCOUNTER — Ambulatory Visit: Payer: BC Managed Care – PPO | Admitting: Family Medicine

## 2020-04-07 ENCOUNTER — Other Ambulatory Visit: Payer: Self-pay | Admitting: Medical

## 2020-07-29 ENCOUNTER — Other Ambulatory Visit: Payer: Self-pay | Admitting: Family Medicine

## 2020-08-02 ENCOUNTER — Other Ambulatory Visit: Payer: Self-pay | Admitting: Family Medicine

## 2020-08-02 NOTE — Telephone Encounter (Signed)
Had to cancel cpe due to Joshua Hickman being out. Will give another for 6 months so we can get him back in when she returns

## 2020-08-09 ENCOUNTER — Encounter: Payer: BLUE CROSS/BLUE SHIELD | Admitting: Family Medicine

## 2020-10-19 ENCOUNTER — Encounter: Payer: BC Managed Care – PPO | Admitting: Family Medicine

## 2020-11-16 ENCOUNTER — Ambulatory Visit (INDEPENDENT_AMBULATORY_CARE_PROVIDER_SITE_OTHER): Payer: BC Managed Care – PPO | Admitting: Family Medicine

## 2020-11-16 ENCOUNTER — Encounter: Payer: Self-pay | Admitting: Family Medicine

## 2020-11-16 ENCOUNTER — Other Ambulatory Visit: Payer: Self-pay

## 2020-11-16 VITALS — BP 138/88 | HR 59 | Ht 65.0 in | Wt 191.4 lb

## 2020-11-16 DIAGNOSIS — N189 Chronic kidney disease, unspecified: Secondary | ICD-10-CM

## 2020-11-16 DIAGNOSIS — R7303 Prediabetes: Secondary | ICD-10-CM | POA: Diagnosis not present

## 2020-11-16 DIAGNOSIS — Z0000 Encounter for general adult medical examination without abnormal findings: Secondary | ICD-10-CM

## 2020-11-16 DIAGNOSIS — Z125 Encounter for screening for malignant neoplasm of prostate: Secondary | ICD-10-CM | POA: Diagnosis not present

## 2020-11-16 DIAGNOSIS — E7800 Pure hypercholesterolemia, unspecified: Secondary | ICD-10-CM | POA: Diagnosis not present

## 2020-11-16 DIAGNOSIS — I10 Essential (primary) hypertension: Secondary | ICD-10-CM | POA: Diagnosis not present

## 2020-11-16 DIAGNOSIS — Z23 Encounter for immunization: Secondary | ICD-10-CM | POA: Diagnosis not present

## 2020-11-16 LAB — POCT URINALYSIS DIP (PROADVANTAGE DEVICE)
Bilirubin, UA: NEGATIVE
Glucose, UA: NEGATIVE mg/dL
Ketones, POC UA: NEGATIVE mg/dL
Leukocytes, UA: NEGATIVE
Nitrite, UA: NEGATIVE
Protein Ur, POC: NEGATIVE mg/dL
Specific Gravity, Urine: 1.015
Urobilinogen, Ur: 0.2
pH, UA: 6 (ref 5.0–8.0)

## 2020-11-16 MED ORDER — AMLODIPINE BESYLATE 5 MG PO TABS: 10.0000 mg | ORAL_TABLET | Freq: Every day | ORAL | 1 refills | Status: DC

## 2020-11-16 NOTE — Addendum Note (Signed)
Addended by: Sheilah Pigeon A on: 11/16/2020 05:18 PM   Modules accepted: Orders

## 2020-11-16 NOTE — Patient Instructions (Signed)
Preventive Care 40-52 Years Old, Male Preventive care refers to lifestyle choices and visits with your health care provider that can promote health and wellness. This includes: A yearly physical exam. This is also called an annual wellness visit. Regular dental and eye exams. Immunizations. Screening for certain conditions. Healthy lifestyle choices, such as: Eating a healthy diet. Getting regular exercise. Not using drugs or products that contain nicotine and tobacco. Limiting alcohol use. What can I expect for my preventive care visit? Physical exam Your health care provider will check your: Height and weight. These may be used to calculate your BMI (body mass index). BMI is a measurement that tells if you are at a healthy weight. Heart rate and blood pressure. Body temperature. Skin for abnormal spots. Counseling Your health care provider may ask you questions about your: Past medical problems. Family's medical history. Alcohol, tobacco, and drug use. Emotional well-being. Home life and relationship well-being. Sexual activity. Diet, exercise, and sleep habits. Work and work environment. Access to firearms. What immunizations do I need? Vaccines are usually given at various ages, according to a schedule. Your health care provider will recommend vaccines for you based on your age, medical history, and lifestyle or other factors, such as travel or where you work. What tests do I need? Blood tests Lipid and cholesterol levels. These may be checked every 5 years, or more often if you are over 52 years old. Hepatitis C test. Hepatitis B test. Screening Lung cancer screening. You may have this screening every year starting at age 52 if you have a 30-pack-year history of smoking and currently smoke or have quit within the past 15 years. Prostate cancer screening. Recommendations will vary depending on your family history and other risks. Genital exam to check for testicular cancer  or hernias. Colorectal cancer screening. All adults should have this screening starting at age 52 and continuing until age 75. Your health care provider may recommend screening at age 45 if you are at increased risk. You will have tests every 1-10 years, depending on your results and the type of screening test. Diabetes screening. This is done by checking your blood sugar (glucose) after you have not eaten for a while (fasting). You may have this done every 1-3 years. STD (sexually transmitted disease) testing, if you are at risk. Follow these instructions at home: Eating and drinking  Eat a diet that includes fresh fruits and vegetables, whole grains, lean protein, and low-fat dairy products. Take vitamin and mineral supplements as recommended by your health care provider. Do not drink alcohol if your health care provider tells you not to drink. If you drink alcohol: Limit how much you have to 0-2 drinks a day. Be aware of how much alcohol is in your drink. In the [place] one drink equals one 12 oz bottle of beer (355 mL), one 5 oz glass of wine (148 mL), or one 1 oz glass of hard liquor (44 mL). Lifestyle Take daily care of your teeth and gums. Brush your teeth every morning and night with fluoride toothpaste. Floss one time each day. Stay active. Exercise for at least 30 minutes 5 or more days each week. Do not use any products that contain nicotine or tobacco, such as cigarettes, e-cigarettes, and chewing tobacco. If you need help quitting, ask your health care provider. Do not use drugs. If you are sexually active, practice safe sex. Use a condom or other form of protection to prevent STIs (sexually transmitted infections). If told by your   health care provider, take low-dose aspirin daily starting at age 52. Find healthy ways to cope with stress, such as: Meditation, yoga, or listening to music. Journaling. Talking to a trusted person. Spending time with friends and  family. Safety Always wear your seat belt while driving or riding in a vehicle. Do not drive: If you have been drinking alcohol. Do not ride with someone who has been drinking. When you are tired or distracted. While texting. Wear a helmet and other protective equipment during sports activities. If you have firearms in your house, make sure you follow all gun safety procedures. What's next? Go to your health care provider once a year for an annual wellness visit. Ask your health care provider how often you should have your eyes and teeth checked. Stay up to date on all vaccines. This information is not intended to replace advice given to you by your health care provider. Make sure you discuss any questions you have with your health care provider. Document Revised: 04/01/2020 Document Reviewed: 01/16/2018 Elsevier Patient Education  2022 [employer]vier Inc.   

## 2020-11-16 NOTE — Progress Notes (Signed)
Subjective:    Patient ID: Joshua Hickman, male    DOB: 1970/05/29, 50 y.o.   MRN: [id number]  HPI Chief Complaint  Patient presents with   Annual Exam   He is here for a complete physical exam.  He had a DOT physical in August.   HTN-states he almost did not pass his DOT physical due to his blood pressure being elevated.  He has been taking amlodipine 5 mg daily.   He had angioedema with lisinopril. Blood pressure at home has been slightly higher than goal also.  States he has made healthy diet changes over the past 2 months and is now eating a low-sodium diet and fewer carbohydrates.  He has been more active.  Prediabetes  CKD-continue monitoring  Vision changes -he has been seen what sounds like floaters and is due for an eye exam   Social history: Lives with wife, works as a Engineer, drilling. Denies smoking, drinking alcohol, drug use Diet: Healthy over the past 2 months Exercise: Has increased walking  Immunizations: covid booster today.  Declines flu vaccine  Health maintenance:   Colonoscopy: 2021 Last PSA: 03/2020 Last Dental Exam:yesterday  Last Eye Exam: over a year ago   Wears seatbelt always, uses sunscreen, smoke detectors in home and functioning, does not text while driving, feels safe in home environment.  Reviewed allergies, medications, past medical, surgical, family, and social history.     Review of Systems Review of Systems Constitutional: -fever, -chills, -sweats, -unexpected weight change,-fatigue ENT: -runny nose, -ear pain, -sore throat Cardiology:  -chest pain, -palpitations, -edema Respiratory: -cough, -shortness of breath, -wheezing Gastroenterology: -abdominal pain, -nausea, -vomiting, -diarrhea, -constipation Hematology: -bleeding or bruising problems Musculoskeletal: -arthralgias, -myalgias, -joint swelling, -back pain Ophthalmology: -vision changes Urology: -dysuria, -difficulty urinating, -hematuria, -urinary frequency,  -urgency Neurology: -headache, -weakness, -tingling, -numbness       Objective:   Physical Exam BP 138/88 (BP Location: Right Arm, Patient Position: Sitting)   Pulse (!) 59   Ht 5\' 5"  (1.651 m)   Wt 191 lb 6.4 oz (86.8 kg)   SpO2 98%   BMI 31.85 kg/m   General Appearance:    Alert, cooperative, no distress, appears stated age  Head:    Normocephalic, without obvious abnormality, atraumatic  Eyes:    PERRL, conjunctiva/corneas clear, EOM's intact  Ears:    Normal TM's and external ear canals  Nose:   Mask on  Throat:   Mask on   Neck:   Supple, no lymphadenopathy;  thyroid:  no   enlargement/tenderness/nodules; no JVD  Back:    Spine nontender, no curvature, ROM normal, no CVA     tenderness  Lungs:     Clear to auscultation bilaterally without wheezes, rales or     ronchi; respirations unlabored  Chest Wall:    No tenderness or deformity   Heart:    Regular rate and rhythm, S1 and S2 normal, no murmur, rub   or gallop  Breast Exam:    No chest wall tenderness, masses or gynecomastia  Abdomen:     Soft, non-tender, nondistended, normoactive bowel sounds,    no masses, no hepatosplenomegaly  Genitalia:    Declines     Extremities:   No clubbing, cyanosis or edema  Pulses:   2+ and symmetric all extremities  Skin:   Skin color, texture, turgor normal, no rashes or lesions  Lymph nodes:   Cervical, supraclavicular, and axillary nodes normal  Neurologic:   CNII-XII intact, normal strength,  sensation and gait          Psych:   Normal mood, affect, hygiene and grooming.        Assessment & Plan:  Routine general medical examination at a health care facility - Plan: CBC with Differential/Platelet, Comprehensive metabolic panel, TSH, T4, free, Lipid panel -Preventive health care reviewed.  Counseling on healthy lifestyle including diet and exercise.  I recommend he call and schedule an eye exam.  Continue regular dental visits.  Discussed immunizations.  Discussed  safety.  Prediabetes - Plan: Hemoglobin A1c, TSH, T4, free -Reportedly has been eating a healthier diet, low in carbohydrates and increased physical activity.  Follow-up pending A1c results  HYPERTENSION, BENIGN ESSENTIAL - Plan: CBC with Differential/Platelet, Comprehensive metabolic panel, amLODipine (NORVASC) 5 MG tablet, POCT Urinalysis DIP (Proadvantage Device) -We will increase his amlodipine from 5 mg to 10 mg.  He will keep an eye on his blood pressure at home.  Discussed trying 7.5 mg if his blood pressure drops too low or if he is concerned about starting on 10 mg.  I recommend a 90-month follow-up or sooner if needed.  Chronic kidney disease, unspecified CKD stage - Plan: Comprehensive metabolic panel, POCT Urinalysis DIP (Proadvantage Device) -Continue to monitor and refer to nephrology as needed  Pure hypercholesterolemia - Plan: Lipid panel -Continue taking statin and eating a healthier, low-fat diet and getting adequate physical activity.  Screening for prostate cancer - Plan: PSA

## 2020-11-17 LAB — CBC WITH DIFFERENTIAL/PLATELET
Basophils Absolute: 0.1 10*3/uL (ref 0.0–0.2)
Basos: 1 %
EOS (ABSOLUTE): 0.1 10*3/uL (ref 0.0–0.4)
Eos: 2 %
Hematocrit: 46.5 % (ref 37.5–51.0)
Hemoglobin: 15.5 g/dL (ref 13.0–17.7)
Immature Grans (Abs): 0 10*3/uL (ref 0.0–0.1)
Immature Granulocytes: 0 %
Lymphocytes Absolute: 1.9 10*3/uL (ref 0.7–3.1)
Lymphs: 38 %
MCH: 29.6 pg (ref 26.6–33.0)
MCHC: 33.3 g/dL (ref 31.5–35.7)
MCV: 89 fL (ref 79–97)
Monocytes Absolute: 0.4 10*3/uL (ref 0.1–0.9)
Monocytes: 7 %
Neutrophils Absolute: 2.5 10*3/uL (ref 1.4–7.0)
Neutrophils: 52 %
Platelets: 256 10*3/uL (ref 150–450)
RBC: 5.24 x10E6/uL (ref 4.14–5.80)
RDW: 12.7 % (ref 11.6–15.4)
WBC: 4.9 10*3/uL (ref 3.4–10.8)

## 2020-11-17 LAB — COMPREHENSIVE METABOLIC PANEL
ALT: 33 IU/L (ref 0–44)
AST: 23 IU/L (ref 0–40)
Albumin/Globulin Ratio: 2.1 (ref 1.2–2.2)
Albumin: 4.8 g/dL (ref 3.8–4.9)
Alkaline Phosphatase: 112 IU/L (ref 44–121)
BUN/Creatinine Ratio: 9 (ref 9–20)
BUN: 13 mg/dL (ref 6–24)
Bilirubin Total: 0.4 mg/dL (ref 0.0–1.2)
CO2: 24 mmol/L (ref 20–29)
Calcium: 9.4 mg/dL (ref 8.7–10.2)
Chloride: 103 mmol/L (ref 96–106)
Creatinine, Ser: 1.43 mg/dL — ABNORMAL HIGH (ref 0.76–1.27)
Globulin, Total: 2.3 g/dL (ref 1.5–4.5)
Glucose: 91 mg/dL (ref 70–99)
Potassium: 4.2 mmol/L (ref 3.5–5.2)
Sodium: 144 mmol/L (ref 134–144)
Total Protein: 7.1 g/dL (ref 6.0–8.5)
eGFR: 59 mL/min/{1.73_m2} — ABNORMAL LOW (ref >59)

## 2020-11-17 LAB — LIPID PANEL
Chol/HDL Ratio: 3.9 ratio (ref 0.0–5.0)
Cholesterol, Total: 161 mg/dL (ref 100–199)
HDL: 41 mg/dL (ref >39)
LDL Chol Calc (NIH): 102 mg/dL — ABNORMAL HIGH (ref 0–99)
Triglycerides: 97 mg/dL (ref 0–149)
VLDL Cholesterol Cal: 18 mg/dL (ref 5–40)

## 2020-11-17 LAB — TSH: TSH: 2.16 u[IU]/mL (ref 0.450–4.500)

## 2020-11-17 LAB — T4, FREE: Free T4: 1.23 ng/dL (ref 0.82–1.77)

## 2020-11-17 LAB — HEMOGLOBIN A1C
Est. average glucose Bld gHb Est-mCnc: 128 mg/dL
Hgb A1c MFr Bld: 6.1 % — ABNORMAL HIGH (ref 4.8–5.6)

## 2020-11-17 LAB — PSA: Prostate Specific Ag, Serum: 0.6 ng/mL (ref 0.0–4.0)

## 2020-11-17 NOTE — Progress Notes (Signed)
His labs are good overall.  His hemoglobin A1c is 6.1% and his diabetes is still well controlled.  His kidney function has improved slightly. -His LDL or bad cholesterol has improved, continue on the cholesterol medication, eating a low-fat diet and exercising

## 2021-02-05 DIAGNOSIS — M329 Systemic lupus erythematosus, unspecified: Secondary | ICD-10-CM

## 2021-02-05 HISTORY — DX: Systemic lupus erythematosus, unspecified: M32.9

## 2021-03-16 ENCOUNTER — Telehealth: Payer: Self-pay | Admitting: Physician Assistant

## 2021-03-16 DIAGNOSIS — I10 Essential (primary) hypertension: Secondary | ICD-10-CM

## 2021-03-16 MED ORDER — AMLODIPINE BESYLATE 5 MG PO TABS: 10.0000 mg | ORAL_TABLET | Freq: Every day | ORAL | 0 refills | Status: DC

## 2021-03-16 NOTE — Addendum Note (Signed)
Addended by: Sheilah Pigeon A on: 03/16/2021 12:56 PM   Modules accepted: Orders

## 2021-03-16 NOTE — Telephone Encounter (Signed)
Pt sent refill request request for norvasc please send to the [facility], [facility]

## 2021-08-25 ENCOUNTER — Other Ambulatory Visit: Payer: Self-pay | Admitting: Family Medicine

## 2021-08-25 DIAGNOSIS — I10 Essential (primary) hypertension: Secondary | ICD-10-CM

## 2021-10-24 ENCOUNTER — Other Ambulatory Visit: Payer: Self-pay | Admitting: Physician Assistant

## 2021-10-24 ENCOUNTER — Other Ambulatory Visit: Payer: Self-pay | Admitting: Family Medicine

## 2021-10-24 DIAGNOSIS — I10 Essential (primary) hypertension: Secondary | ICD-10-CM

## 2022-01-21 ENCOUNTER — Other Ambulatory Visit: Payer: Self-pay | Admitting: Family Medicine

## 2022-01-21 DIAGNOSIS — I10 Essential (primary) hypertension: Secondary | ICD-10-CM

## 2022-10-12 ENCOUNTER — Other Ambulatory Visit: Payer: Self-pay | Admitting: Nephrology

## 2022-10-12 DIAGNOSIS — N1831 Chronic kidney disease, stage 3a: Secondary | ICD-10-CM

## 2022-10-15 IMAGING — DX DG KNEE COMPLETE 4+V*L*
4 series · 4 of 4 positions shown · non-contrast
Comparison: None.

CLINICAL DATA: Left knee pain.

EXAM:
LEFT KNEE - COMPLETE 4+ VIEW

[knee pa]
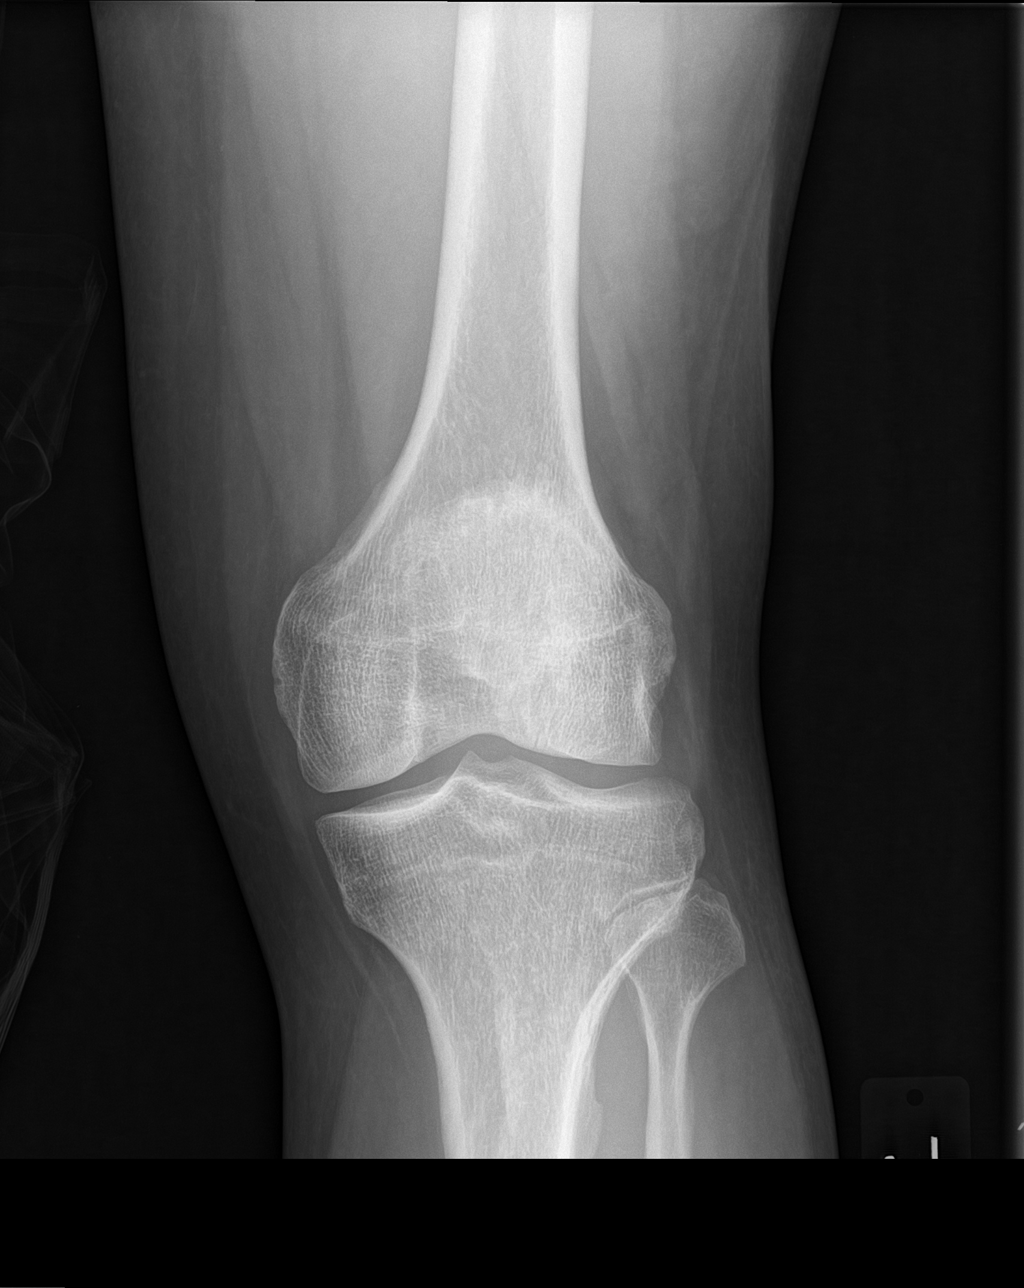

[knee obl (1 of 2)]
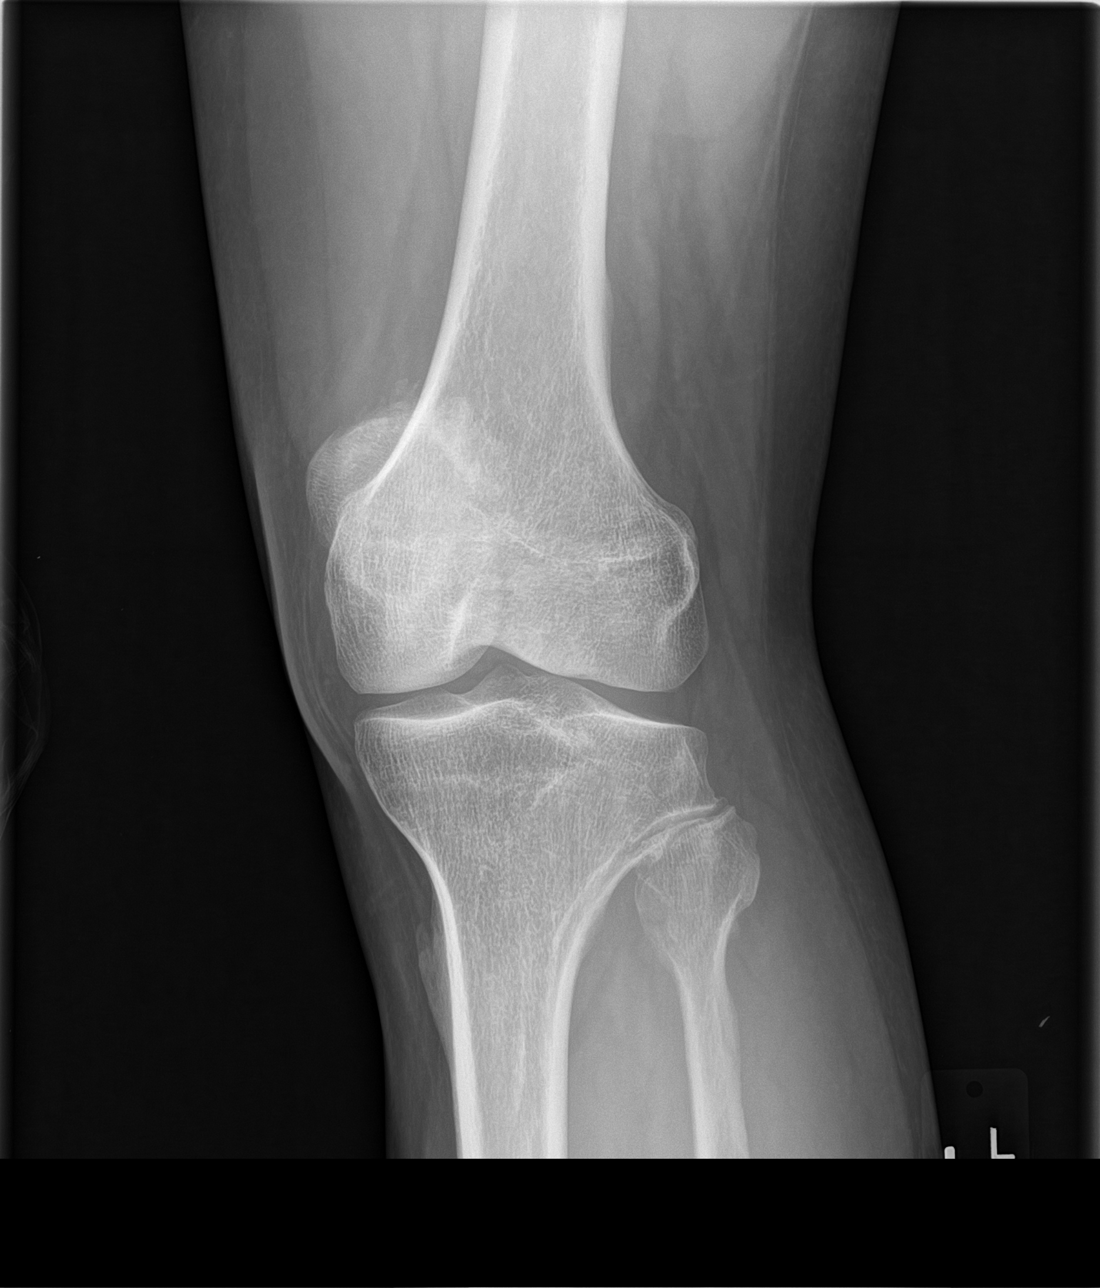

[knee obl (2 of 2)]
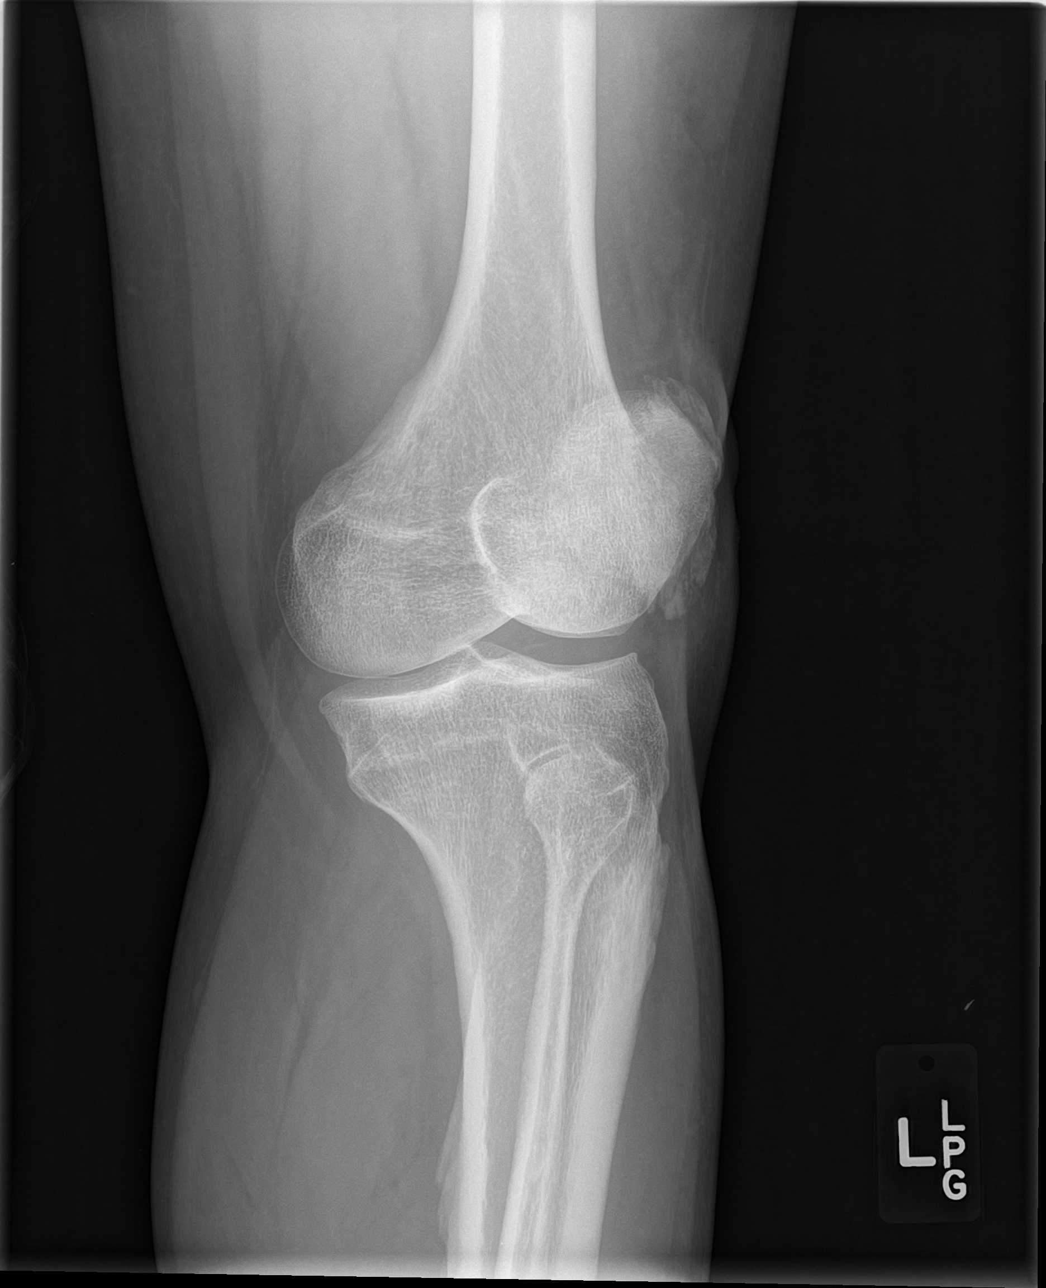

[knee lat]
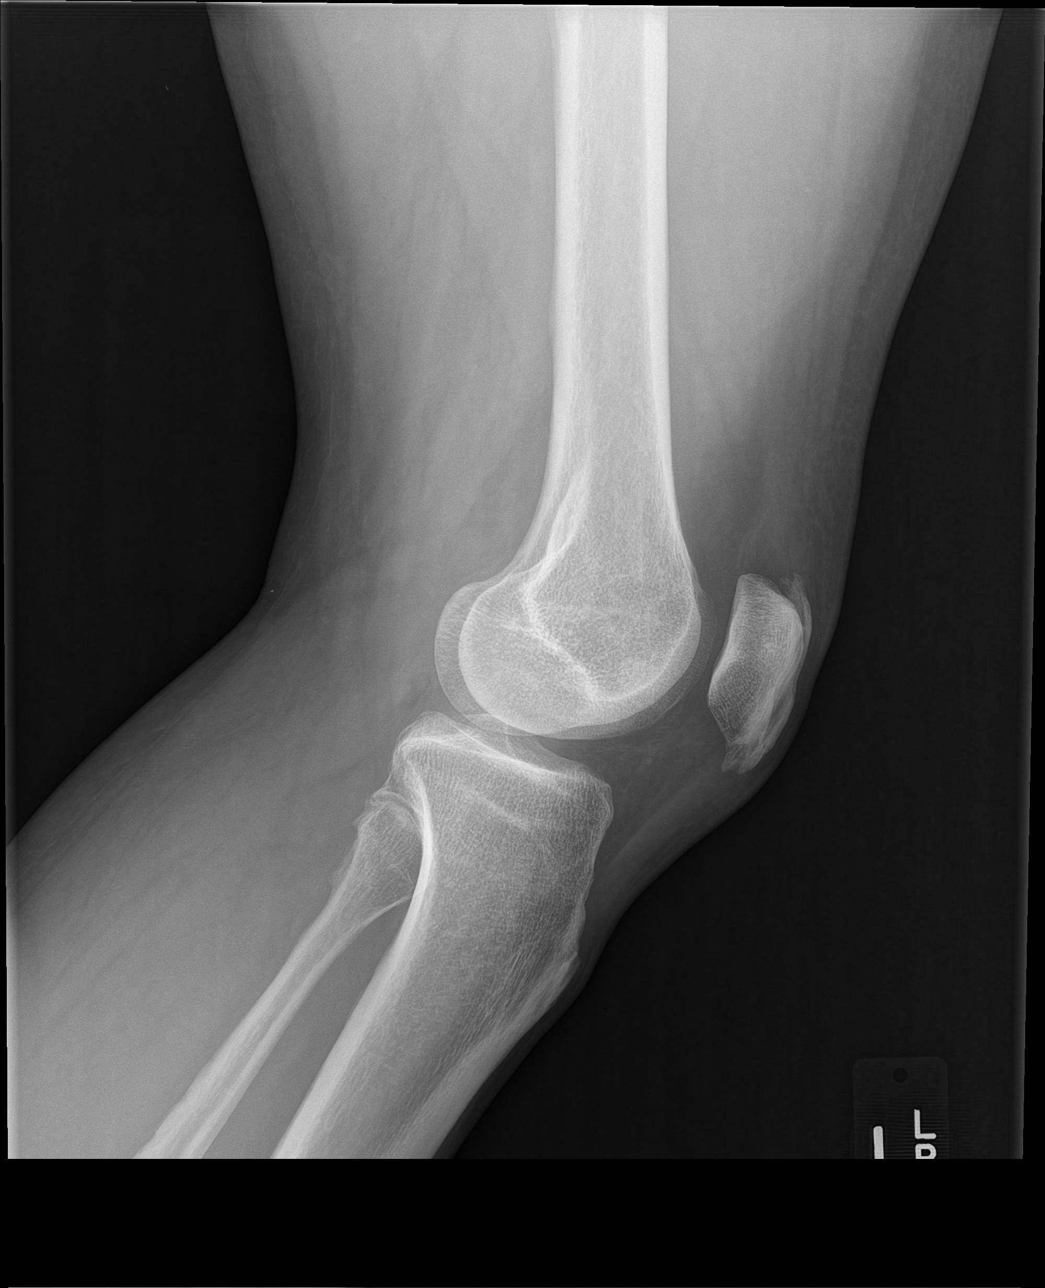

[4 of 4 positions shown; findings below may reference images not displayed]

FINDINGS: Physiologic alignment with approximation of the joints. No fracture
or focal osseous lesion. Patellar enthesophytes. No soft tissue
swelling or joint effusion.
IMPRESSION: No acute osseous abnormality.

## 2022-10-23 ENCOUNTER — Ambulatory Visit
Admission: RE | Admit: 2022-10-23 | Discharge: 2022-10-23 | Disposition: A | Discharge status: Home / Self Care | Payer: BC Managed Care – PPO | Source: Ambulatory Visit | Attending: Nephrology

## 2022-10-23 DIAGNOSIS — N1831 Chronic kidney disease, stage 3a: Secondary | ICD-10-CM

## 2022-11-15 ENCOUNTER — Ambulatory Visit: Payer: BC Managed Care – PPO | Admitting: Medical

## 2022-11-15 VITALS — BP 118/80 | HR 70 | Temp 97.0°F | Wt 205.8 lb

## 2022-11-15 DIAGNOSIS — M62838 Other muscle spasm: Secondary | ICD-10-CM | POA: Diagnosis not present

## 2022-11-15 DIAGNOSIS — M549 Dorsalgia, unspecified: Secondary | ICD-10-CM

## 2022-11-15 DIAGNOSIS — M542 Cervicalgia: Secondary | ICD-10-CM

## 2022-11-15 MED ORDER — METHOCARBAMOL 500 MG PO TABS: 500.0000 mg | ORAL_TABLET | Freq: Two times a day (BID) | ORAL | 0 refills | Status: DC | PRN

## 2022-11-15 NOTE — Progress Notes (Signed)
Subjective:  Joshua Hickman is a 52 y.o. male who presents for Chief Complaint  Patient presents with   Back Pain    Back pain and then pain from arm up to neck was hurting the other night. Having cramping in both upper legs     Here for some pains.  Overall better today but in the past few days he has had random pains in the left arm.  He notes pain in left arm started a few days ago.  Pain includes neck down to arm.  He is also had back pain x several  days.  Was standing and he started feeling the spasm or pain in his left mid to low back.  Sometimes gets cramps in the legs.  Today  not a lot of pain at all just feels a little discomfort in his back and arm.  No numbness ting or weakness.  No recent strenuous activity out of the ordinary.    Works as Naval architect.    Using some Excedrin for pain as that usually helps.  Has hx/o lupus, diagnosed around a year ago.  Sees rheumatology at [facility].   No other aggravating or relieving factors.    No other c/o.  Past Medical History:  Diagnosis Date   Chronic kidney disease    CKD stable    Eczema    Elevated liver enzymes 01/06/2018   Family history of liver cancer 03/14/2020   Brother    Hemorrhoid    History of nuclear stress test 03/29/2009   normal exam without evidence of exercise-induced sichemia   Hyperlipidemia    Hypertension    Prediabetes 03/14/2020   Current Outpatient Medications on File Prior to Visit  Medication Sig Dispense Refill   amLODipine (NORVASC) 10 MG tablet Take by mouth.     atorvastatin (LIPITOR) 40 MG tablet Take 1 tablet (40 mg total) by mouth daily. 90 tablet 1   triamcinolone cream (KENALOG) 0.1 % Apply to affected areas twice daily     No current facility-administered medications on file prior to visit.     The following portions of the patient's history were reviewed and updated as appropriate: allergies, current medications, past family history, past medical history, past social history,  past surgical history and problem list.  ROS Otherwise as in subjective above     Objective: BP 118/80   Pulse 70   Temp (!) 97 F (36.1 C)   Wt 205 lb 12.8 oz (93.4 kg)   BMI 34.25 kg/m   Wt Readings from Last 3 Encounters:  11/15/22 205 lb 12.8 oz (93.4 kg)  11/16/20 191 lb 6.4 oz (86.8 kg)  03/09/20 193 lb 12.8 oz (87.9 kg)    General appearance: alert, no distress, well developed, well nourished Neck: supple, no lymphadenopathy, no thyromegaly, no masses, nontender with normal range of motion Back nontender with normal range of motion  Arms and legs with no obvious deformity, normal range of motion of legs and arms without pain  Arms neurovascularly intact  pulses: 2+ radial pulses, 2+ pedal pulses, normal cap refill Ext: no edema   Assessment: Encounter Diagnoses  Name Primary?   Neck pain Yes   Acute left-sided back pain, unspecified back location      Plan: We discussed his recent symptoms and exam.  Exam unremarkable today.  It sounds like he recently had some muscle spasm and inflammation.  He is better today  Advise good hydration, relative rest, stretching regularly.  Can  use Robaxin as below when he gets a muscle spasm or tension in the muscle  Can use Tylenol as needed.  We discussed other potential causes of the symptoms.  No obvious sign of radicular issue today.  He has a scheduled planned soon within the next week or 2.  We will plan routine labs then given the recent muscle cramps  Joshua Hickman was seen today for back pain.  Diagnoses and all orders for this visit:  Neck pain  Acute left-sided back pain, unspecified back location  Other orders -     methocarbamol (ROBAXIN) 500 MG tablet; Take 1 tablet (500 mg total) by mouth 2 (two) times daily as needed for muscle spasms.    Follow up: As needed

## 2022-11-22 ENCOUNTER — Ambulatory Visit (INDEPENDENT_AMBULATORY_CARE_PROVIDER_SITE_OTHER): Payer: BC Managed Care – PPO | Admitting: Medical

## 2022-11-22 ENCOUNTER — Encounter: Payer: Self-pay | Admitting: Medical

## 2022-11-22 ENCOUNTER — Telehealth: Payer: Self-pay | Admitting: Medical

## 2022-11-22 VITALS — BP 122/84 | HR 61 | Ht 66.5 in | Wt 204.0 lb

## 2022-11-22 DIAGNOSIS — M329 Systemic lupus erythematosus, unspecified: Secondary | ICD-10-CM

## 2022-11-22 DIAGNOSIS — E7800 Pure hypercholesterolemia, unspecified: Secondary | ICD-10-CM

## 2022-11-22 DIAGNOSIS — R7303 Prediabetes: Secondary | ICD-10-CM

## 2022-11-22 DIAGNOSIS — Z125 Encounter for screening for malignant neoplasm of prostate: Secondary | ICD-10-CM

## 2022-11-22 DIAGNOSIS — Z0000 Encounter for general adult medical examination without abnormal findings: Secondary | ICD-10-CM | POA: Diagnosis not present

## 2022-11-22 DIAGNOSIS — I10 Essential (primary) hypertension: Secondary | ICD-10-CM

## 2022-11-22 DIAGNOSIS — Z1211 Encounter for screening for malignant neoplasm of colon: Secondary | ICD-10-CM

## 2022-11-22 DIAGNOSIS — N189 Chronic kidney disease, unspecified: Secondary | ICD-10-CM | POA: Diagnosis not present

## 2022-11-22 DIAGNOSIS — Z2821 Immunization not carried out because of patient refusal: Secondary | ICD-10-CM

## 2022-11-22 LAB — POCT URINALYSIS DIP (PROADVANTAGE DEVICE)
Bilirubin, UA: NEGATIVE
Blood, UA: NEGATIVE
Glucose, UA: NEGATIVE mg/dL
Ketones, POC UA: NEGATIVE mg/dL
Leukocytes, UA: NEGATIVE
Nitrite, UA: NEGATIVE
Protein Ur, POC: NEGATIVE mg/dL
Specific Gravity, Urine: 1.015
Urobilinogen, Ur: NEGATIVE
pH, UA: 7 (ref 5.0–8.0)

## 2022-11-22 NOTE — Patient Instructions (Signed)
This visit was a preventative care visit, also known as wellness visit or routine physical.   Topics typically include healthy lifestyle, diet, exercise, preventative care, vaccinations, sick and well care, proper use of emergency dept and after hours care, as well as other concerns.     Separate significant issues discussed: Hypertension-continue amlodipine 10 mg daily  Hyperlipidemia-continue atorvastatin/Lipitor 40 mg daily  We will request recent labs and recent records from [facility] rheumatology  Lupus-on monthly infusion, seeing rheumatology regularly    General Recommendations: Continue to return yearly for your annual wellness and preventative care visits.  This gives [place] a chance to discuss healthy lifestyle, exercise, vaccinations, review your chart record, and perform screenings where appropriate.  I recommend you see your eye doctor yearly for routine vision care.  I recommend you see your dentist yearly for routine dental care including hygiene visits twice yearly.   Vaccination  Immunization History  Administered Date(s) Administered   PFIZER(Purple Top)SARS-COV-2 Vaccination 07/09/2019, 07/30/2019, 02/16/2020   Pfizer Covid-19 Vaccine Bivalent Booster 24yrs & up 11/16/2020   Tdap 06/11/2016    Vaccine recommendations: Declines vaccines today   Screening for cancer: Colon cancer screening: Prior or last colon cancer screen: 2021, due repeat in 2028 Sent home with FOBT testing today   Prostate Cancer screening: The recommended prostate cancer screening test is a blood test called the prostate-specific antigen (PSA) test. PSA is a protein that is made in the prostate. As you age, your prostate naturally produces more PSA. Abnormally high PSA levels may be caused by: Prostate cancer. An enlarged prostate that is not caused by cancer (benign prostatic hyperplasia, or BPH). This condition is very common in older men. A prostate gland infection (prostatitis) or  urinary tract infection. Certain medicines such as male hormones (like testosterone) or other medicines that raise testosterone levels. A rectal exam may be done as part of prostate cancer screening to help provide information about the size of your prostate gland. When a rectal exam is performed, it should be done after the PSA level is drawn to avoid any effect on the results.   Skin cancer screening: Check your skin regularly for new changes, growing lesions, or other lesions of concern Come in for evaluation if you have skin lesions of concern.   Lung cancer screening: If you have a greater than 20 pack year history of tobacco use, then you may qualify for lung cancer screening with a chest CT scan.   Please call your insurance company to inquire about coverage for this test.   Pancreatic cancer:  no current screening test is available or routinely recommended. (risk factors: smoking, overweight or obese, diabetes, chronic pancreatitis, work exposure - dry cleaning, metal working, 52yo>, M>F, Tree surgeon, family hx/o, hereditary breast, ovarian, melanoma, lynch, peutz-jeghers).  Symptoms: jaundice, dark urine, light color or greasy stools, itchy skin, belly or back pain, weight loss, poor appetite, nause, vomiting, liver enlargement, DVT/blood clots.   We currently don't have screenings for other cancers besides breast, cervical, colon, and lung cancers.  If you have a strong family history of cancer or have other cancer screening concerns, please let me know.  Genetic testing referral is an option for individuals with high cancer risk in the family.  There are some other cancer screenings in development currently.   Bone health: Get at least 150 minutes of aerobic exercise weekly Get weight bearing exercise at least once weekly Bone density test:  A bone density test is an imaging test that  uses a type of X-ray to measure the amount of calcium and other minerals in your bones. The  test may be used to diagnose or screen you for a condition that causes weak or thin bones (osteoporosis), predict your risk for a broken bone (fracture), or determine how well your osteoporosis treatment is working. The bone density test is recommended for females 65 and older, or females or males <65 if certain risk factors such as thyroid disease, long term use of steroids such as for asthma or rheumatological issues, vitamin D deficiency, estrogen deficiency, family history of osteoporosis, self or family history of fragility fracture in first degree relative.    Heart health: Get at least 150 minutes of aerobic exercise weekly Limit alcohol It is important to maintain a healthy blood pressure and healthy cholesterol numbers  Heart disease screening: Screening for heart disease includes screening for blood pressure, fasting lipids, glucose/diabetes screening, BMI height to weight ratio, reviewed of smoking status, physical activity, and diet.    Goals include blood pressure 120/80 or less, maintaining a healthy lipid/cholesterol profile, preventing diabetes or keeping diabetes numbers under good control, not smoking or using tobacco products, exercising most days per week or at least 150 minutes per week of exercise, and eating healthy variety of fruits and vegetables, healthy oils, and avoiding unhealthy food choices like fried food, fast food, high sugar and high cholesterol foods.    Other tests may possibly include EKG test, CT coronary calcium score, echocardiogram, exercise treadmill stress test.        Vascular disease screening: For higher risk individuals including smokers, diabetics, patients with known heart disease or high blood pressure, kidney disease, and others, screening for vascular disease or atherosclerosis of the arteries is available.  Examples may include carotid ultrasound, abdominal aortic ultrasound, ABI blood flow screening in the legs, thoracic aorta  screening.    Medical care options: I recommend you continue to seek care here first for routine care.  We try really hard to have available appointments Monday through Friday daytime hours for sick visits, acute visits, and physicals.  Urgent care should be used for after hours and weekends for significant issues that cannot wait till the next day.  The emergency department should be used for significant potentially life-threatening emergencies.  The emergency department is expensive, can often have long wait times for less significant concerns, so try to utilize primary care, urgent care, or telemedicine when possible to avoid unnecessary trips to the emergency department.  Virtual visits and telemedicine have been introduced since the pandemic started in 2020, and can be convenient ways to receive medical care.  We offer virtual appointments as well to assist you in a variety of options to seek medical care.   Legal  Take the time to do a last will and testament, Advanced Directives including Health Care Power of Attorney and Living Will documents.  Don't leave your family with burdens that can be handled ahead of time.   Advanced Directives: I recommend you consider completing a Health Care Power of Attorney and Living Will.   These documents respect your wishes and help alleviate burdens on your loved ones if you were to become terminally ill or be in a position to need those documents enforced.    You can complete Advanced Directives yourself, have them notarized, then have copies made for our office, for you and for anybody you feel should have them in safe keeping.  Or, you can have an attorney prepare  these documents.   If you haven't updated your Last Will and Testament in a while, it may be worthwhile having an attorney prepare these documents together and save on some costs.       Spiritual and Emotional Health Keeping a healthy spiritual life can help you better manage your physical  health. Your spiritual life can help you to cope with any issues that may arise with your physical health.  Balance can keep [place] healthy and help [place] to recover.  If you are struggling with your spiritual health there are questions that you may want to ask yourself:  What makes me feel most complete? When do I feel most connected to the rest of the world? Where do I find the most inner strength? What am I doing when I feel whole?  Helpful tips: Being in nature. Some people feel very connected and at peace when they are walking outdoors or are outside. Helping others. Some feel the largest sense of wellbeing when they are of service to others. Being of service can take on many forms. It can be doing volunteer work, being kind to strangers, or offering a hand to a friend in need. Gratitude. Some people find they feel the most connected when they remain grateful. They may make lists of all the things they are grateful for or say a thank you out loud for all they have.    Emotional Health Are you in tune with your emotional health?  Check out this link: http://www.marquez-love.com/    Financial Health Make sure you use a budget for your personal finances Make sure you are insured against risks (health insurance, life insurance, auto insurance, etc) Save more, spend less Set financial goals If you need help in this area, good resources include counseling through [employer] or other community resources, have a meeting with a Social research officer, government, and a good resource is the [employer]

## 2022-11-22 NOTE — Progress Notes (Signed)
Subjective:   HPI  Joshua Hickman is a 52 y.o. male who presents for Chief Complaint  Patient presents with   Annual Exam    Fasting cpe, no concerns, declines flu and covid    Patient Care Team: Analeya Luallen, Cleda Mccreedy as PCP - General (Family Medicine) Dr. Claudette Head, GI Dr. Rueben Bash, [facility] prior Dr. Liana Crocker, general surgery Dr. Ambrose Finland, PA, [facility] rheumatology Dr. Marcelle Overlie, orthopedics  Concerns: Doing okay.  Compliant with medications.  Diagnosed with lupus within the past year.  He has been doing monthly infusion of Benlysta for roughly the past 9 months.  Was having a lot of joint flareup before that  He has a soft tissue lump on his left shoulder.  He has seen general surgery for consult.  Not ready to do surgery yet.  He is compliant with his blood pressure and cholesterol medication.  Uses Robaxin periodically for muscle cramps and spasm   Reviewed their medical, surgical, family, social, medication, and allergy history and updated chart as appropriate.  Allergies  Allergen Reactions   Lisinopril Hives and Swelling    Angioedema and hives    Past Medical History:  Diagnosis Date   Chronic kidney disease    CKD stable    Eczema    Elevated liver enzymes 01/06/2018   Family history of liver cancer 03/14/2020   Brother    Hemorrhoid    History of nuclear stress test 03/29/2009   normal exam without evidence of exercise-induced sichemia   Hyperlipidemia    Hypertension    Lupus (systemic lupus erythematosus) (HCC) 2023   [facility]   Prediabetes 03/14/2020    Current Outpatient Medications on File Prior to Visit  Medication Sig Dispense Refill   amLODipine (NORVASC) 10 MG tablet Take by mouth.     atorvastatin (LIPITOR) 40 MG tablet Take 1 tablet (40 mg total) by mouth daily. 90 tablet 1   Belimumab (BENLYSTA IV) Inject into the vein. Monthly infusion     methocarbamol (ROBAXIN) 500 MG  tablet Take 1 tablet (500 mg total) by mouth 2 (two) times daily as needed for muscle spasms. 30 tablet 0   triamcinolone cream (KENALOG) 0.1 % Apply to affected areas twice daily     No current facility-administered medications on file prior to visit.      Current Outpatient Medications:    amLODipine (NORVASC) 10 MG tablet, Take by mouth., Disp: , Rfl:    atorvastatin (LIPITOR) 40 MG tablet, Take 1 tablet (40 mg total) by mouth daily., Disp: 90 tablet, Rfl: 1   Belimumab (BENLYSTA IV), Inject into the vein. Monthly infusion, Disp: , Rfl:    methocarbamol (ROBAXIN) 500 MG tablet, Take 1 tablet (500 mg total) by mouth 2 (two) times daily as needed for muscle spasms., Disp: 30 tablet, Rfl: 0   triamcinolone cream (KENALOG) 0.1 %, Apply to affected areas twice daily, Disp: , Rfl:   Family History  Problem Relation Age of Onset   Heart attack Father 78   Hypertension Brother        also abdominal cancer   Liver cancer Brother    Brain cancer Maternal Aunt    Cancer Cousin        blood cancer- died age 84-7 yo    Colon polyps Neg Hx    Colon cancer Neg Hx    Esophageal cancer Neg Hx    Rectal cancer Neg Hx    Stomach cancer Neg Hx  Past Surgical History:  Procedure Laterality Date   COLONOSCOPY  10/2019   Dr. Claudette Head, repeat 2028   TRANSTHORACIC ECHOCARDIOGRAM  05/25/2012   EF 55-60%, mild conc hypertrophy, normal systolic function; mild MR   Review of Systems  Constitutional:  Negative for chills, fever, malaise/fatigue and weight loss.  HENT:  Negative for congestion, ear pain, hearing loss, sore throat and tinnitus.   Eyes:  Negative for blurred vision, pain and redness.  Respiratory:  Negative for cough, hemoptysis and shortness of breath.   Cardiovascular:  Negative for chest pain, palpitations, orthopnea, claudication and leg swelling.  Gastrointestinal:  Negative for abdominal pain, blood in stool, constipation, diarrhea, nausea and vomiting.  Genitourinary:   Negative for dysuria, flank pain, frequency, hematuria and urgency.  Musculoskeletal:  Positive for myalgias. Negative for falls and joint pain.  Skin:  Negative for itching and rash.  Neurological:  Negative for dizziness, tingling, speech change, weakness and headaches.  Endo/Heme/Allergies:  Negative for polydipsia. Does not bruise/bleed easily.  Psychiatric/Behavioral:  Negative for depression and memory loss. The patient is not nervous/anxious and does not have insomnia.       Objective:  BP 122/84   Pulse 61   Ht 5' 6.5" (1.689 m)   Wt 204 lb (92.5 kg)   BMI 32.43 kg/m   General appearance: alert, no distress, WD/WN, African American male Skin: Scar over the right face lateral linear marking, tattoo right upper arm, otherwise no worrisome lesions HEENT: normocephalic, conjunctiva/corneas normal, sclerae anicteric, PERRLA, EOMi, nares patent, no discharge or erythema, pharynx normal Oral cavity: MMM, tongue normal, teeth normal Neck: supple, no lymphadenopathy, no thyromegaly, no masses, normal ROM, no bruits Chest: non tender, normal shape and expansion Heart: RRR, normal S1, S2, no murmurs Lungs: CTA bilaterally, no wheezes, rhonchi, or rales Abdomen: +bs, soft, non tender, non distended, no masses, no hepatomegaly, no splenomegaly, no bruits Back: non tender, normal ROM, no scoliosis Musculoskeletal: upper extremities non tender, no obvious deformity, normal ROM throughout, lower extremities non tender, no obvious deformity, normal ROM throughout Extremities: no edema, no cyanosis, no clubbing Pulses: 2+ symmetric, upper and lower extremities, normal cap refill Neurological: alert, oriented x 3, CN2-12 intact, strength normal upper extremities and lower extremities, sensation normal throughout, DTRs 2+ throughout, no cerebellar signs, gait normal Psychiatric: normal affect, behavior normal, pleasant  GU/rectal - deferred/declined    Assessment and Plan :   Encounter  Diagnoses  Name Primary?   Encounter for health maintenance examination in adult Yes   HYPERTENSION, BENIGN ESSENTIAL    HYPERCHOLESTEROLEMIA    Chronic kidney disease, unspecified CKD stage    Prediabetes    Systemic lupus erythematosus, unspecified SLE type, unspecified organ involvement status (HCC)    Screening for prostate cancer    Screening for colon cancer    Influenza vaccination declined     This visit was a preventative care visit, also known as wellness visit or routine physical.   Topics typically include healthy lifestyle, diet, exercise, preventative care, vaccinations, sick and well care, proper use of emergency dept and after hours care, as well as other concerns.     Separate significant issues discussed: Hypertension-continue amlodipine 10 mg daily  Hyperlipidemia-continue atorvastatin/Lipitor 40 mg daily  We will request recent labs and recent records from [facility] rheumatology  Lupus-on monthly infusion, seeing rheumatology regularly    General Recommendations: Continue to return yearly for your annual wellness and preventative care visits.  This gives [place] a chance to discuss healthy lifestyle,  exercise, vaccinations, review your chart record, and perform screenings where appropriate.  I recommend you see your eye doctor yearly for routine vision care.  I recommend you see your dentist yearly for routine dental care including hygiene visits twice yearly.   Vaccination  Immunization History  Administered Date(s) Administered   PFIZER(Purple Top)SARS-COV-2 Vaccination 07/09/2019, 07/30/2019, 02/16/2020   Pfizer Covid-19 Vaccine Bivalent Booster 62yrs & up 11/16/2020   Tdap 06/11/2016    Vaccine recommendations: Declines vaccines today   Screening for cancer: Colon cancer screening: Prior or last colon cancer screen: 2021, due repeat in 2028 Sent home with FOBT testing today   Prostate Cancer screening: The recommended prostate cancer  screening test is a blood test called the prostate-specific antigen (PSA) test. PSA is a protein that is made in the prostate. As you age, your prostate naturally produces more PSA. Abnormally high PSA levels may be caused by: Prostate cancer. An enlarged prostate that is not caused by cancer (benign prostatic hyperplasia, or BPH). This condition is very common in older men. A prostate gland infection (prostatitis) or urinary tract infection. Certain medicines such as male hormones (like testosterone) or other medicines that raise testosterone levels. A rectal exam may be done as part of prostate cancer screening to help provide information about the size of your prostate gland. When a rectal exam is performed, it should be done after the PSA level is drawn to avoid any effect on the results.   Skin cancer screening: Check your skin regularly for new changes, growing lesions, or other lesions of concern Come in for evaluation if you have skin lesions of concern.   Lung cancer screening: If you have a greater than 20 pack year history of tobacco use, then you may qualify for lung cancer screening with a chest CT scan.   Please call your insurance company to inquire about coverage for this test.   Pancreatic cancer:  no current screening test is available or routinely recommended. (risk factors: smoking, overweight or obese, diabetes, chronic pancreatitis, work exposure - dry cleaning, metal working, 52yo>, M>F, Tree surgeon, family hx/o, hereditary breast, ovarian, melanoma, lynch, peutz-jeghers).  Symptoms: jaundice, dark urine, light color or greasy stools, itchy skin, belly or back pain, weight loss, poor appetite, nause, vomiting, liver enlargement, DVT/blood clots.   We currently don't have screenings for other cancers besides breast, cervical, colon, and lung cancers.  If you have a strong family history of cancer or have other cancer screening concerns, please let me know.  Genetic  testing referral is an option for individuals with high cancer risk in the family.  There are some other cancer screenings in development currently.   Bone health: Get at least 150 minutes of aerobic exercise weekly Get weight bearing exercise at least once weekly Bone density test:  A bone density test is an imaging test that uses a type of X-ray to measure the amount of calcium and other minerals in your bones. The test may be used to diagnose or screen you for a condition that causes weak or thin bones (osteoporosis), predict your risk for a broken bone (fracture), or determine how well your osteoporosis treatment is working. The bone density test is recommended for females 65 and older, or females or males <65 if certain risk factors such as thyroid disease, long term use of steroids such as for asthma or rheumatological issues, vitamin D deficiency, estrogen deficiency, family history of osteoporosis, self or family history of fragility fracture in first degree  relative.    Heart health: Get at least 150 minutes of aerobic exercise weekly Limit alcohol It is important to maintain a healthy blood pressure and healthy cholesterol numbers  Heart disease screening: Screening for heart disease includes screening for blood pressure, fasting lipids, glucose/diabetes screening, BMI height to weight ratio, reviewed of smoking status, physical activity, and diet.    Goals include blood pressure 120/80 or less, maintaining a healthy lipid/cholesterol profile, preventing diabetes or keeping diabetes numbers under good control, not smoking or using tobacco products, exercising most days per week or at least 150 minutes per week of exercise, and eating healthy variety of fruits and vegetables, healthy oils, and avoiding unhealthy food choices like fried food, fast food, high sugar and high cholesterol foods.    Other tests may possibly include EKG test, CT coronary calcium score, echocardiogram,  exercise treadmill stress test.        Vascular disease screening: For higher risk individuals including smokers, diabetics, patients with known heart disease or high blood pressure, kidney disease, and others, screening for vascular disease or atherosclerosis of the arteries is available.  Examples may include carotid ultrasound, abdominal aortic ultrasound, ABI blood flow screening in the legs, thoracic aorta screening.    Medical care options: I recommend you continue to seek care here first for routine care.  We try really hard to have available appointments Monday through Friday daytime hours for sick visits, acute visits, and physicals.  Urgent care should be used for after hours and weekends for significant issues that cannot wait till the next day.  The emergency department should be used for significant potentially life-threatening emergencies.  The emergency department is expensive, can often have long wait times for less significant concerns, so try to utilize primary care, urgent care, or telemedicine when possible to avoid unnecessary trips to the emergency department.  Virtual visits and telemedicine have been introduced since the pandemic started in 2020, and can be convenient ways to receive medical care.  We offer virtual appointments as well to assist you in a variety of options to seek medical care.   Legal  Take the time to do a last will and testament, Advanced Directives including Health Care Power of Attorney and Living Will documents.  Don't leave your family with burdens that can be handled ahead of time.   Advanced Directives: I recommend you consider completing a Health Care Power of Attorney and Living Will.   These documents respect your wishes and help alleviate burdens on your loved ones if you were to become terminally ill or be in a position to need those documents enforced.    You can complete Advanced Directives yourself, have them notarized, then have copies  made for our office, for you and for anybody you feel should have them in safe keeping.  Or, you can have an attorney prepare these documents.   If you haven't updated your Last Will and Testament in a while, it may be worthwhile having an attorney prepare these documents together and save on some costs.       Spiritual and Emotional Health Keeping a healthy spiritual life can help you better manage your physical health. Your spiritual life can help you to cope with any issues that may arise with your physical health.  Balance can keep [place] healthy and help [place] to recover.  If you are struggling with your spiritual health there are questions that you may want to ask yourself:  What makes me feel most  complete? When do I feel most connected to the rest of the world? Where do I find the most inner strength? What am I doing when I feel whole?  Helpful tips: Being in nature. Some people feel very connected and at peace when they are walking outdoors or are outside. Helping others. Some feel the largest sense of wellbeing when they are of service to others. Being of service can take on many forms. It can be doing volunteer work, being kind to strangers, or offering a hand to a friend in need. Gratitude. Some people find they feel the most connected when they remain grateful. They may make lists of all the things they are grateful for or say a thank you out loud for all they have.    Emotional Health Are you in tune with your emotional health?  Check out this link: http://www.marquez-love.com/    Financial Health Make sure you use a budget for your personal finances Make sure you are insured against risks (health insurance, life insurance, auto insurance, etc) Save more, spend less Set financial goals If you need help in this area, good resources include counseling through [employer] or other community resources, have a meeting with a Social research officer, government, and a good resource  is the [employer]    [place] was seen today for annual exam.  Diagnoses and all orders for this visit:  Encounter for health maintenance examination in adult -     Lipid panel -     Hemoglobin A1c -     PSA -     POCT Urinalysis DIP (Proadvantage Device) -     TSH  HYPERTENSION, BENIGN ESSENTIAL  HYPERCHOLESTEROLEMIA  Chronic kidney disease, unspecified CKD stage  Prediabetes -     Hemoglobin A1c  Systemic lupus erythematosus, unspecified SLE type, unspecified organ involvement status (HCC) -     POCT Urinalysis DIP (Proadvantage Device) -     TSH  Screening for prostate cancer -     PSA  Screening for colon cancer -     Fecal occult blood, imunochemical  Influenza vaccination declined   Follow-up pending labs, yearly for physical

## 2022-11-22 NOTE — Telephone Encounter (Signed)
records request faxed [facility] Rheumatology  [phone]

## 2022-11-23 LAB — HEMOGLOBIN A1C
Est. average glucose Bld gHb Est-mCnc: 131 mg/dL
Hgb A1c MFr Bld: 6.2 % — ABNORMAL HIGH (ref 4.8–5.6)

## 2022-11-23 LAB — LIPID PANEL
Chol/HDL Ratio: 3.6 {ratio} (ref 0.0–5.0)
Cholesterol, Total: 160 mg/dL (ref 100–199)
HDL: 45 mg/dL (ref >39)
LDL Chol Calc (NIH): 101 mg/dL — ABNORMAL HIGH (ref 0–99)
Triglycerides: 71 mg/dL (ref 0–149)
VLDL Cholesterol Cal: 14 mg/dL (ref 5–40)

## 2022-11-23 LAB — PSA: Prostate Specific Ag, Serum: 0.9 ng/mL (ref 0.0–4.0)

## 2022-11-23 LAB — TSH: TSH: 2.19 u[IU]/mL (ref 0.450–4.500)

## 2022-11-23 NOTE — Progress Notes (Signed)
Results sent through MyChart

## 2022-11-28 ENCOUNTER — Telehealth: Payer: Self-pay | Admitting: Medical

## 2022-11-28 LAB — FECAL OCCULT BLOOD, IMMUNOCHEMICAL: Fecal Occult Bld: NEGATIVE

## 2022-11-28 NOTE — Telephone Encounter (Signed)
Records received from [facility] and sent back in folder

## 2022-11-29 NOTE — Progress Notes (Signed)
Results sent through MyChart

## 2023-05-15 ENCOUNTER — Ambulatory Visit: Admitting: Medical

## 2023-05-15 VITALS — BP 124/78 | HR 97 | Temp 99.0°F | Wt 207.6 lb

## 2023-05-15 DIAGNOSIS — L237 Allergic contact dermatitis due to plants, except food: Secondary | ICD-10-CM | POA: Diagnosis not present

## 2023-05-15 DIAGNOSIS — K1370 Unspecified lesions of oral mucosa: Secondary | ICD-10-CM

## 2023-05-15 MED ORDER — HYDROXYZINE HCL 10 MG PO TABS
10.0000 mg | ORAL_TABLET | Freq: Three times a day (TID) | ORAL | 0 refills | Status: AC | PRN
Start: 2023-05-15 — End: ?

## 2023-05-15 MED ORDER — TRIAMCINOLONE ACETONIDE 0.1 % EX CREA: TOPICAL_CREAM | Freq: Two times a day (BID) | CUTANEOUS | 0 refills | Status: DC

## 2023-05-15 NOTE — Patient Instructions (Signed)
 Poison ivy rash on the arms Begin hydroxyzine 10 mg tablet 2 or 3 times a day to help with itching rash.  We will also see if this helps with some mouth issue.  Caution as this can cause sleepiness. Begin triamcinolone cream topically to your arms twice a day for the next 3 to 5 days and this will hopefully help resolve the poison ivy rash Avoid reexposure to the plants or trees he may have been around recently   Regarding the mouth issue with skin reaction Lets see if the hydroxyzine tablet above helps with this over the next week as well I would recommend using a food elimination diet for the time being to try to narrow down whether this may be a food allergy For example, for the meantime cut out mango and pineapple, cut out any artificial colors or processed foods, avoid most citrus and tomato-based foods for the time being, avoid acidic or spicy foods for the time being Consider eating only certain foods for the next week such as rice or potatoes or 1 or 2 specific fruits or vegetables that you know typically does not give you any problems as long as is not citrus or tomatoes After 1 week of doing this if the mouth skin issue is improving and you can add back another food at a time If you are not improving over the next week or 2 then we may need to consider either allergist referral or possibly even having you see an oral surgeon for a biopsy of the skin inside your mouth

## 2023-05-15 NOTE — Progress Notes (Signed)
 Subjective:  Joshua Hickman is a 53 y.o. male who presents for Chief Complaint  Patient presents with   poision ivy    Poision ivy x 1 week and getting worse.  Mouth is peeling inside and needs to know what to do.      Here for a couple concerns.  He had another friend was doing some work with some landscaping recently and got some poison ivy rash.  He has rash on both forearms.  He has been using some calamine lotion and cortisone cream.  It is not going away completely.  It is itchy.  He notes that he has had skin peeling inside of the mouth and irritated skin inside the mouth for probably 7 or 8 months.  He is on a medication per his rheumatologist for lupus, injection once monthly but was on this before the mouth issue ever started.  They felt like it may not be coming from the medication.  He has been on prednisone by rheumatology and that did not help.  He has tried different types of toothpaste, has done salt water gargles and nothing seems to help.  He is wondering if it could be a food allergy.  No other aggravating or relieving factors.    No other c/o.  Past Medical History:  Diagnosis Date   Chronic kidney disease    CKD stable    Eczema    Elevated liver enzymes 01/06/2018   Family history of liver cancer 03/14/2020   Brother    Hemorrhoid    History of nuclear stress test 03/29/2009   normal exam without evidence of exercise-induced sichemia   Hyperlipidemia    Hypertension    Lupus (systemic lupus erythematosus) (HCC) 2023   [facility] Rheumatology   Prediabetes 03/14/2020   Current Outpatient Medications on File Prior to Visit  Medication Sig Dispense Refill   amLODipine (NORVASC) 10 MG tablet Take by mouth.     atorvastatin (LIPITOR) 40 MG tablet Take 1 tablet (40 mg total) by mouth daily. 90 tablet 1   Belimumab (BENLYSTA IV) Inject into the vein. Monthly infusion     methocarbamol (ROBAXIN) 500 MG tablet Take 1 tablet (500 mg total) by mouth 2 (two)  times daily as needed for muscle spasms. (Patient not taking: Reported on 05/15/2023) 30 tablet 0   No current facility-administered medications on file prior to visit.     The following portions of the patient's history were reviewed and updated as appropriate: allergies, current medications, past family history, past medical history, past social history, past surgical history and problem list.  ROS Otherwise as in subjective above  Objective: BP 124/78   Pulse 97   Temp 99 F (37.2 C)   Wt 207 lb 9.6 oz (94.2 kg)   BMI 33.01 kg/m   General appearance: alert, no distress, well developed, well nourished Skin: Bilateral forearms anterior and medial surface with scattered urticarial rash consistent with poison ivy HEENT: normocephalic, sclerae anicteric, conjunctiva pink and moist,  pharynx normal Oral cavity: MMM, several patches inside the mouth with irritated appearance of whitish-pink irritated areas that would appear to be some erosions but not necessarily ulcerative appearing, no leukoplakia Neck: supple, no lymphadenopathy, no thyromegaly, no masses   Assessment: Encounter Diagnoses  Name Primary?   Poison ivy dermatitis Yes   Oral mucosal lesion      Plan: Poison ivy rash on the arms Begin hydroxyzine 10 mg tablet 2 or 3 times a day to help with itching  rash.  We will also see if this helps with some mouth issue.  Caution as this can cause sleepiness. Begin triamcinolone cream topically to your arms twice a day for the next 3 to 5 days and this will hopefully help resolve the poison ivy rash Avoid reexposure to the plants or trees he may have been around recently   Regarding the mouth issue with skin reaction Lets see if the hydroxyzine tablet above helps with this over the next week as well I would recommend using a food elimination diet for the time being to try to narrow down whether this may be a food allergy For example, for the meantime cut out mango and  pineapple, cut out any artificial colors or processed foods, avoid most citrus and tomato-based foods for the time being, avoid acidic or spicy foods for the time being Consider eating only certain foods for the next week such as rice or potatoes or 1 or 2 specific fruits or vegetables that you know typically does not give you any problems as long as is not citrus or tomatoes After 1 week of doing this if the mouth skin issue is improving and you can add back another food at a time If you are not improving over the next week or 2 then we may need to consider either allergist referral or possibly even having you see an oral surgeon for a biopsy of the skin inside your mouth  [place] was seen today for poision ivy.  Diagnoses and all orders for this visit:  Poison ivy dermatitis  Oral mucosal lesion  Other orders -     triamcinolone cream (KENALOG) 0.1 %; Apply topically 2 (two) times daily. -     hydrOXYzine (ATARAX) 10 MG tablet; Take 1 tablet (10 mg total) by mouth 3 (three) times daily as needed.    Follow up: call report 2 weeks

## 2023-05-16 ENCOUNTER — Ambulatory Visit: Admitting: Medical

## 2023-05-23 ENCOUNTER — Encounter: Payer: BC Managed Care – PPO | Admitting: Medical

## 2023-05-28 ENCOUNTER — Encounter: Payer: BC Managed Care – PPO | Admitting: Medical

## 2023-06-18 ENCOUNTER — Ambulatory Visit: Admitting: Medical

## 2023-06-18 VITALS — BP 110/70 | HR 66 | Temp 97.2°F | Wt 207.4 lb

## 2023-06-18 DIAGNOSIS — N1831 Chronic kidney disease, stage 3a: Secondary | ICD-10-CM | POA: Diagnosis not present

## 2023-06-18 DIAGNOSIS — K1370 Unspecified lesions of oral mucosa: Secondary | ICD-10-CM | POA: Insufficient documentation

## 2023-06-18 DIAGNOSIS — N189 Chronic kidney disease, unspecified: Secondary | ICD-10-CM

## 2023-06-18 DIAGNOSIS — R7303 Prediabetes: Secondary | ICD-10-CM

## 2023-06-18 LAB — CBC WITH DIFFERENTIAL/PLATELET
Basophils Absolute: 0.1 10*3/uL (ref 0.0–0.2)
Basos: 1 %
EOS (ABSOLUTE): 0.1 10*3/uL (ref 0.0–0.4)
Eos: 2 %
Hematocrit: 47.5 % (ref 37.5–51.0)
Hemoglobin: 15.5 g/dL (ref 13.0–17.7)
Immature Grans (Abs): 0 10*3/uL (ref 0.0–0.1)
Immature Granulocytes: 0 %
Lymphocytes Absolute: 1.8 10*3/uL (ref 0.7–3.1)
Lymphs: 31 %
MCH: 29.8 pg (ref 26.6–33.0)
MCHC: 32.6 g/dL (ref 31.5–35.7)
MCV: 91 fL (ref 79–97)
Monocytes Absolute: 0.4 10*3/uL (ref 0.1–0.9)
Monocytes: 7 %
Neutrophils Absolute: 3.5 10*3/uL (ref 1.4–7.0)
Neutrophils: 59 %
Platelets: 299 10*3/uL (ref 150–450)
RBC: 5.2 x10E6/uL (ref 4.14–5.80)
RDW: 13.1 % (ref 11.6–15.4)
WBC: 5.9 10*3/uL (ref 3.4–10.8)

## 2023-06-18 LAB — HEMOGLOBIN A1C
Est. average glucose Bld gHb Est-mCnc: 134 mg/dL
Hgb A1c MFr Bld: 6.3 % — ABNORMAL HIGH (ref 4.8–5.6)

## 2023-06-18 MED ORDER — FLUCONAZOLE 100 MG PO TABS: ORAL_TABLET | ORAL | 0 refills | Status: AC

## 2023-06-18 NOTE — Progress Notes (Signed)
 Subjective:  Joshua Hickman is a 53 y.o. male who presents for Chief Complaint  Patient presents with   Acute Visit    Mouth peeling inside mouth, tongue feels like sand paper. Not sure if its from medication      Here for ongoing concerns inside the mouth.  I saw him about a month ago for similar.  He also sees rheumatology and is on a injection per rheumatology.  He has had this ongoing irritation inside the mouth for a month.  He recently has tried both Magic mouthwash and Mycelex troche per rheumatology prescriptions, and  those 2 treatments did not seem to do anything.  He had already been working on diet changes to avoid certain foods to see if that resolves the issue but it has not.  He still has whitish patches and irritation inside the mouth, particularly circumferentially inside the mouth and lips.  He is prediabetic.  He notes having a negative HIV test in the last year or so.  No other aggravating or relieving factors.    No other c/o.  The following portions of the patient's history were reviewed and updated as appropriate: allergies, current medications, past family history, past medical history, past social history, past surgical history and problem list.  ROS Otherwise as in subjective above  Objective: BP 110/70   Pulse 66   Temp (!) 97.2 F (36.2 C)   Wt 207 lb 6.4 oz (94.1 kg)   BMI 32.97 kg/m   General appearance: alert, no distress, well developed, well nourished There are whitish patches and some pinkish erythematous patches inside the upper and lower lips and on the buccal mucosa   Assessment: Encounter Diagnoses  Name Primary?   Oral mucosal lesion Yes   Chronic kidney disease, unspecified CKD stage    Prediabetes    CKD stage 3a, GFR 45-59 ml/min (HCC)      Plan: We discussed the findings on the inside of her mouth that suggest thrush.  He reports negative HIV test in the past year.  He is prediabetic.  We will update some labs as below.  Begin  oral Diflucan trial with a reduced dose given his GFR.  Advise if this does not resolve within the next week I would recommend he see an oral surgeon for biopsy.  His wife works at a [employer] and she will inquire about getting him an appointment.    [name] was seen today for acute visit.  Diagnoses and all orders for this visit:  Oral mucosal lesion  Chronic kidney disease, unspecified CKD stage -     CBC with Differential/Platelet  Prediabetes -     Hemoglobin A1c  CKD stage 3a, GFR 45-59 ml/min (HCC)  Other orders -     fluconazole (DIFLUCAN) 100 MG tablet; 1 tablet day 1, then 1/2 tablet for 7-10 days    Follow up: call back in 1 week

## 2023-06-19 ENCOUNTER — Ambulatory Visit: Payer: Self-pay | Admitting: Medical

## 2023-06-19 NOTE — Progress Notes (Signed)
 Results sent through MyChart

## 2023-08-27 ENCOUNTER — Other Ambulatory Visit: Payer: Self-pay | Admitting: Medical

## 2023-09-25 ENCOUNTER — Ambulatory Visit: Admitting: Medical

## 2023-09-25 ENCOUNTER — Ambulatory Visit: Payer: Self-pay | Admitting: *Deleted

## 2023-09-25 ENCOUNTER — Encounter: Payer: Self-pay | Admitting: Medical

## 2023-09-25 VITALS — BP 140/88 | HR 80 | Ht 66.0 in | Wt 209.0 lb

## 2023-09-25 DIAGNOSIS — M542 Cervicalgia: Secondary | ICD-10-CM | POA: Diagnosis not present

## 2023-09-25 DIAGNOSIS — R29898 Other symptoms and signs involving the musculoskeletal system: Secondary | ICD-10-CM | POA: Diagnosis not present

## 2023-09-25 DIAGNOSIS — D1720 Benign lipomatous neoplasm of skin and subcutaneous tissue of unspecified limb: Secondary | ICD-10-CM | POA: Diagnosis not present

## 2023-09-25 MED ORDER — METHOCARBAMOL 500 MG PO TABS: 500.0000 mg | ORAL_TABLET | Freq: Two times a day (BID) | ORAL | 0 refills | Status: AC | PRN

## 2023-09-25 NOTE — Patient Instructions (Signed)
Please go to [facility] for your neck xray.   Their hours are 8am - 4:30 pm Monday - Friday.  Take your insurance card with you.  [facility] [phone]   [address] [place], [place] [address]

## 2023-09-25 NOTE — Progress Notes (Signed)
 Subjective:  Joshua Hickman is a 53 y.o. male who presents for Chief Complaint  Patient presents with   Follow-up    Knot on left shoulder getting bigger and causing neck pain     Here for lipoma.  He has a history of lipoma left shoulder.  He saw orthopedics maybe a year or so ago with [facility].  At the time they want him to do a scan and he decided not to move forward with surgery.  it has been giving him some problems of late though he would like to go back and see them.  He also notes ongoing neck pain particularly on the back and left neck.  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  His neck has been hurt more of late.  No arm numbness tingling or weakness.  Using over-the-counter analgesics frequently for this  No other aggravating or relieving factors.    No other c/o.  Past Medical History:  Diagnosis Date   Chronic kidney disease    CKD stable    Eczema    Elevated liver enzymes 01/06/2018   Family history of liver cancer 03/14/2020   Brother    Hemorrhoid    History of nuclear stress test 03/29/2009   normal exam without evidence of exercise-induced sichemia   Hyperlipidemia    Hypertension    Lupus (systemic lupus erythematosus) (HCC) 2023   [facility]   Prediabetes 03/14/2020   Current Outpatient Medications on File Prior to Visit  Medication Sig Dispense Refill   amLODipine  (NORVASC ) 10 MG tablet Take by mouth.     atorvastatin  (LIPITOR) 40 MG tablet Take 1 tablet (40 mg total) by mouth daily. 90 tablet 1   Belimumab (BENLYSTA IV) Inject into the vein. Monthly infusion     triamcinolone  cream (KENALOG ) 0.1 % APPLY TOPICALLY TO THE AFFECTED AREA TWICE DAILY 45 g 0   cholecalciferol (VITAMIN D3) 25 MCG (1000 UNIT) tablet 1 tablet Orally Once a day (Patient not taking: Reported on 09/25/2023)     colchicine 0.6 MG tablet Take 0.6 mg by mouth daily. (Patient not taking: Reported on 09/25/2023)     fluconazole  (DIFLUCAN ) 100 MG  tablet 1 tablet day 1, then 1/2 tablet for 7-10 days (Patient not taking: Reported on 09/25/2023) 10 tablet 0   hydrOXYzine  (ATARAX ) 10 MG tablet Take 1 tablet (10 mg total) by mouth 3 (three) times daily as needed. (Patient not taking: Reported on 09/25/2023) 30 tablet 0   No current facility-administered medications on file prior to visit.     The following portions of the patient's history were reviewed and updated as appropriate: allergies, current medications, past family history, past medical history, past social history, past surgical history and problem list.  ROS Otherwise as in subjective above    Objective: BP (!) 140/88 (BP Location: Right Arm, Patient Position: Sitting)   Pulse 80   Ht 5' 6 (1.676 m)   Wt 209 lb (94.8 kg)   SpO2 98%   BMI 33.73 kg/m   General appearance: alert, no distress, well developed, well nourished Neck tender left lateral neck and posterior neck, reduced extension otherwise neck range of motion is normal.  No mass no lymphadenopathy.  No thyromegaly. Left anterior deltoid with approximately 6 cm spongy raised mass suggestive of lipoma.  Otherwise arm nontender with normal range of motion Arms neurovascularly intact   Assessment: Encounter Diagnoses  Name Primary?   Lipoma  of upper arm Yes   Neck pain    Decreased ROM of neck      Plan: Lipoma-referral back to [facility] orthopedics for consult for surgery  Neck pain and decreased range of motion-Go for x-ray.  Will likely refer to chiropractor or physical therapy.  Can use muscle laxer at night nightly, caution with sedation.  Work on neck range of motion and stretching.    Chauncy was seen today for follow-up.  Diagnoses and all orders for this visit:  Lipoma of upper arm -     Ambulatory referral to Orthopedic Surgery  Neck pain -     DG Cervical Spine Complete; Future  Decreased ROM of neck -     DG Cervical Spine Complete; Future  Other orders -     methocarbamol   (ROBAXIN ) 500 MG tablet; Take 1 tablet (500 mg total) by mouth 2 (two) times daily as needed for muscle spasms.    Follow up: pending xray

## 2023-09-25 NOTE — Telephone Encounter (Signed)
 FYI Only or Action Required?: FYI only for provider.  Patient was last seen in primary care on 06/18/2023 by Bulah Alm RAMAN, PA-C.  Called Nurse Triage reporting Mass.  Symptoms began several years ago.  Interventions attempted: Tylenol , ibuprofen  Symptoms are: gradually worsening.  Triage Disposition: See Physician Within 24 Hours  Patient/caregiver understands and will follow disposition?: Yes   Reason for Disposition  [1] Swelling is painful to touch AND [2] no fever  Answer Assessment - Initial Assessment Questions 1. APPEARANCE of SWELLING: What does it look like?     Getting bigger- 3.5 long, 2 high- large, normal color 2. SIZE: How large is the swelling? (e.g., inches, cm; or compare to size of pinhead, tip of pen, eraser, coin, pea, grape, ping pong ball)      See above 3. LOCATION: Where is the swelling located?     L shoulder- close to the neck 4. ONSET: When did the swelling start?     5 years, Started changing recently 5. COLOR: What color is it? Is there more than one color?     Normal skin tone 6. PAIN: Is there any pain? If Yes, ask: How bad is the pain? 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 ITCH: Does it itch? If Yes, ask: How bad is the itch?      no 8. CAUSE: What do you think caused the swelling?     unsure 9 OTHER SYMPTOMS: Do you have any other symptoms? (e.g., fever)     no  Protocols used: Skin Lump or Localized Swelling-A-AH   Copied from CRM #[id number]. Topic: Clinical - Red Word Triage >> Sep 25, 2023  9:25 AM Graeme ORN wrote: Red Word that prompted transfer to Nurse Triage: Knot shoulder growing causing pain in neck - can't turn neck at times

## 2023-11-28 ENCOUNTER — Encounter: Payer: BC Managed Care – PPO | Admitting: Medical

## 2024-02-28 ENCOUNTER — Encounter: Payer: Self-pay | Admitting: Medical
